# Patient Record
Sex: Female | Born: 1935 | Race: White | Hispanic: No | Smoking: Former smoker
Health system: Southern US, Community
[De-identification: ages and names within clinical notes are randomized; demographics above are authoritative.]

## PROBLEM LIST (undated history)

## (undated) DIAGNOSIS — I728 Aneurysm of other specified arteries: Secondary | ICD-10-CM

## (undated) DIAGNOSIS — C439 Malignant melanoma of skin, unspecified: Secondary | ICD-10-CM

## (undated) DIAGNOSIS — Z9889 Other specified postprocedural states: Secondary | ICD-10-CM

## (undated) DIAGNOSIS — B019 Varicella without complication: Secondary | ICD-10-CM

## (undated) DIAGNOSIS — R911 Solitary pulmonary nodule: Secondary | ICD-10-CM

## (undated) DIAGNOSIS — E039 Hypothyroidism, unspecified: Secondary | ICD-10-CM

## (undated) DIAGNOSIS — I2510 Atherosclerotic heart disease of native coronary artery without angina pectoris: Secondary | ICD-10-CM

## (undated) DIAGNOSIS — K635 Polyp of colon: Secondary | ICD-10-CM

## (undated) DIAGNOSIS — I10 Essential (primary) hypertension: Secondary | ICD-10-CM

## (undated) DIAGNOSIS — J439 Emphysema, unspecified: Secondary | ICD-10-CM

## (undated) DIAGNOSIS — I739 Peripheral vascular disease, unspecified: Secondary | ICD-10-CM

## (undated) HISTORY — PX: TONSILLECTOMY AND ADENOIDECTOMY: SUR1326

## (undated) HISTORY — DX: Emphysema, unspecified: J43.9

## (undated) HISTORY — DX: Polyp of colon: K63.5

## (undated) HISTORY — DX: Atherosclerotic heart disease of native coronary artery without angina pectoris: I25.10

## (undated) HISTORY — DX: Aneurysm of other specified arteries: I72.8

## (undated) HISTORY — DX: Malignant melanoma of skin, unspecified: C43.9

## (undated) HISTORY — DX: Essential (primary) hypertension: I10

## (undated) HISTORY — PX: THYROID SURGERY: SHX805

## (undated) HISTORY — PX: ANEURYSM COILING: SHX5349

## (undated) HISTORY — DX: Solitary pulmonary nodule: R91.1

## (undated) HISTORY — DX: Peripheral vascular disease, unspecified: I73.9

## (undated) HISTORY — DX: Varicella without complication: B01.9

## (undated) HISTORY — DX: Other specified postprocedural states: Z98.890

## (undated) HISTORY — DX: Hypothyroidism, unspecified: E03.9

## (undated) HISTORY — PX: OTHER SURGICAL HISTORY: SHX169

---

## 2018-04-12 ENCOUNTER — Encounter: Payer: Self-pay | Admitting: Adult Health

## 2018-04-12 ENCOUNTER — Ambulatory Visit (INDEPENDENT_AMBULATORY_CARE_PROVIDER_SITE_OTHER): Payer: Medicare Other | Admitting: Adult Health

## 2018-04-12 VITALS — BP 134/70 | Temp 98.5°F | Wt 175.0 lb

## 2018-04-12 DIAGNOSIS — C439 Malignant melanoma of skin, unspecified: Secondary | ICD-10-CM

## 2018-04-12 DIAGNOSIS — I728 Aneurysm of other specified arteries: Secondary | ICD-10-CM | POA: Diagnosis not present

## 2018-04-12 DIAGNOSIS — E039 Hypothyroidism, unspecified: Secondary | ICD-10-CM | POA: Insufficient documentation

## 2018-04-12 DIAGNOSIS — Z7689 Persons encountering health services in other specified circumstances: Secondary | ICD-10-CM

## 2018-04-12 DIAGNOSIS — I10 Essential (primary) hypertension: Secondary | ICD-10-CM | POA: Diagnosis not present

## 2018-04-12 LAB — BASIC METABOLIC PANEL
BUN: 16 mg/dL (ref 6–23)
CHLORIDE: 107 meq/L (ref 96–112)
CO2: 22 mEq/L (ref 19–32)
Calcium: 10.5 mg/dL (ref 8.4–10.5)
Creatinine, Ser: 0.8 mg/dL (ref 0.40–1.20)
GFR: 72.47 mL/min (ref >60.00)
Glucose, Bld: 115 mg/dL — ABNORMAL HIGH (ref 70–99)
Potassium: 4.3 mEq/L (ref 3.5–5.1)
Sodium: 140 mEq/L (ref 135–145)

## 2018-04-12 LAB — TSH: TSH: 0.18 u[IU]/mL — ABNORMAL LOW (ref 0.35–4.50)

## 2018-04-12 NOTE — Progress Notes (Signed)
Patient presents to clinic today to establish care. She is a pleasant 84 year old female who  has a past medical history of History of thyroid surgery.  Her daughter is with her to help provide history.   She recently moved from [facility] of last CPE  Acute Concerns: Establish Care   Chronic Issues: Post surgical Hypothyroidism - Takes Synthroid 112 mcg   Hypertension - takes Cozzar 50 mg daily  BP Readings from Last 3 Encounters:  04/12/18 134/70   H/O Melanoma - " years ago". Was not followed by dermatology in the past.   Hyperlipidemia - Not currently on any medications. Has myalgias with many statins   Seasonal Allergies - takes Allegra   Splenic artery aneurysm - was followed by vascular surgery in [place]. She would like to establish in [place].    Health Maintenance: Dental -- Routine Care  Vision -- Dr. Katy Fitch.  Immunizations -- utd  Colonoscopy -- no longer needs  Mammogram -- no longer needs PAP -- no longer needs Bone Density -- unknown    Past Medical History:  Diagnosis Date  . History of thyroid surgery     History reviewed. No pertinent surgical history.  Current Outpatient Medications on File Prior to Visit  Medication Sig Dispense Refill  . chlorpheniramine (CHLOR-TRIMETON) 4 MG tablet Take 4 mg by mouth as needed for allergies. Used along side of Allegra.    . fexofenadine (ALLEGRA) 180 MG tablet Take 180 mg by mouth daily.    Marland Kitchen levothyroxine (SYNTHROID, LEVOTHROID) 112 MCG tablet Take 112 mcg by mouth daily before breakfast.    . losartan (COZAAR) 50 MG tablet Take 50 mg by mouth daily.     No current facility-administered medications on file prior to visit.     Allergies  Allergen Reactions  . Codeine   . Statins     Myalgia   . Sulfa Antibiotics     Family History  Adopted: Yes    Social History   Socioeconomic History  . Marital status: Widowed    Spouse name: Not on file  . Number of children: Not on file  . Years of  education: Not on file  . Highest education level: Not on file  Occupational History  . Not on file  Social Needs  . Financial resource strain: Not on file  . Food insecurity:    Worry: Not on file    Inability: Not on file  . Transportation needs:    Medical: Not on file    Non-medical: Not on file  Tobacco Use  . Smoking status: Former Research scientist (life sciences)  . Smokeless tobacco: Never Used  Substance and Sexual Activity  . Alcohol use: Yes    Comment: Glass of wine occasionally  . Drug use: Never  . Sexual activity: Not on file  Lifestyle  . Physical activity:    Days per week: Not on file    Minutes per session: Not on file  . Stress: Not on file  Relationships  . Social connections:    Talks on phone: Not on file    Gets together: Not on file    Attends religious service: Not on file    Active member of club or organization: Not on file    Attends meetings of clubs or organizations: Not on file    Relationship status: Not on file  . Intimate partner violence:    Fear of current or ex partner: Not on file  Emotionally abused: Not on file    Physically abused: Not on file    Forced sexual activity: Not on file  Other Topics Concern  . Not on file  Social History Narrative  . Not on file    Review of Systems  Constitutional: Negative.   Respiratory: Negative.   Cardiovascular: Negative.   Genitourinary: Negative.   Musculoskeletal: Negative.   Neurological: Negative.   Psychiatric/Behavioral: Negative.   All other systems reviewed and are negative.   BP 134/70   Temp 98.5 F (36.9 C) (Oral)   Wt 175 lb (79.4 kg)   Physical Exam  Constitutional: She is oriented to person, place, and time. She appears well-developed and well-nourished. No distress.  Cardiovascular: Normal rate, regular rhythm, normal heart sounds and intact distal pulses. Exam reveals no gallop and no friction rub.  No murmur heard. Pulmonary/Chest: Effort normal and breath sounds normal. No stridor.  No respiratory distress. She has no wheezes. She has no rales. She exhibits no tenderness.  Neurological: She is alert and oriented to person, place, and time.  Skin: Skin is warm and dry. Capillary refill takes less than 2 seconds. No rash noted. She is not diaphoretic. No erythema. No pallor.  Psychiatric: She has a normal mood and affect. Her behavior is normal. Judgment and thought content normal.  Nursing note and vitals reviewed.  Assessment/Plan: 1. Encounter to establish care - Will request paperwork from old PCP in [place].  - Follow up at anytime in the meantime   2. Hypothyroidism, unspecified type  - levothyroxine (SYNTHROID, LEVOTHROID) 112 MCG tablet; Take 112 mcg by mouth daily before breakfast. - TSH  3. Essential hypertension - No change  - losartan (COZAAR) 50 MG tablet; Take 50 mg by mouth daily. - Basic Metabolic Panel  4. Splenic artery aneurysm [facility])  - Ambulatory referral to Vascular Surgery  5. Malignant melanoma, unspecified site [facility]) - She will establish care with her daughters dermatologist   Dorothyann Peng, NP

## 2018-04-12 NOTE — Patient Instructions (Signed)
It was great meeting you today   I will follow up with you regarding your blood work   We will request your information and be in touch with you about your next physical   Please let me know if you need anything

## 2018-04-13 ENCOUNTER — Encounter: Payer: Self-pay | Admitting: Family Medicine

## 2018-04-17 ENCOUNTER — Telehealth: Payer: Self-pay

## 2018-04-17 DIAGNOSIS — E039 Hypothyroidism, unspecified: Secondary | ICD-10-CM

## 2018-04-17 MED ORDER — LEVOTHYROXINE SODIUM 88 MCG PO TABS: 88.0000 ug | ORAL_TABLET | Freq: Every day | ORAL | 3 refills | Status: DC

## 2018-04-17 NOTE — Telephone Encounter (Signed)
Rx sent to pharmacy per result note.

## 2018-04-17 NOTE — Telephone Encounter (Signed)
Copied from [place] [id number]. Topic: General - Other >> Apr 17, 2018  2:12 PM Carolyn Stare wrote:  Pt went to [facility] there was no RX there and was told a new RX was going to be snet in for 88mg    Her synthroid dose is still too much. I am going to drop her to 88 mcg and have her retest in a month   [employer]

## 2018-04-18 ENCOUNTER — Encounter: Payer: Self-pay | Admitting: Family Medicine

## 2018-05-05 ENCOUNTER — Encounter: Payer: Medicare Other | Admitting: Vascular Surgery

## 2018-05-05 ENCOUNTER — Encounter (HOSPITAL_COMMUNITY): Payer: Medicare Other

## 2018-05-15 ENCOUNTER — Other Ambulatory Visit (INDEPENDENT_AMBULATORY_CARE_PROVIDER_SITE_OTHER): Payer: Medicare Other

## 2018-05-15 DIAGNOSIS — E039 Hypothyroidism, unspecified: Secondary | ICD-10-CM | POA: Diagnosis not present

## 2018-05-15 LAB — TSH: TSH: 5.15 u[IU]/mL — ABNORMAL HIGH (ref 0.35–4.50)

## 2018-05-17 ENCOUNTER — Other Ambulatory Visit: Payer: Self-pay | Admitting: Family Medicine

## 2018-05-17 ENCOUNTER — Telehealth: Payer: Self-pay | Admitting: Adult Health

## 2018-05-17 DIAGNOSIS — E039 Hypothyroidism, unspecified: Secondary | ICD-10-CM

## 2018-05-17 DIAGNOSIS — I10 Essential (primary) hypertension: Secondary | ICD-10-CM

## 2018-05-17 MED ORDER — LOSARTAN POTASSIUM 50 MG PO TABS: 50.0000 mg | ORAL_TABLET | Freq: Every day | ORAL | 3 refills | Status: DC

## 2018-05-17 NOTE — Telephone Encounter (Signed)
Copied from [place] [id number] [phone]. Topic: Quick Communication - See Telephone Encounter >> May 17, 2018 11:40 AM Burchel, Abbi R wrote: CRM for notification. See Telephone encounter for: 05/17/18.  Pt's daughter Gaetana Michaelis) states she has some questions about pt's recent lab work and would like call  back from [employer] or his nurse to discuss.    [name]: [phone]

## 2018-05-30 ENCOUNTER — Other Ambulatory Visit: Payer: Self-pay

## 2018-05-30 DIAGNOSIS — I728 Aneurysm of other specified arteries: Secondary | ICD-10-CM

## 2018-06-01 ENCOUNTER — Other Ambulatory Visit: Payer: Self-pay

## 2018-06-01 ENCOUNTER — Ambulatory Visit (INDEPENDENT_AMBULATORY_CARE_PROVIDER_SITE_OTHER): Payer: Medicare Other | Admitting: Vascular Surgery

## 2018-06-01 ENCOUNTER — Ambulatory Visit (HOSPITAL_COMMUNITY)
Admission: RE | Admit: 2018-06-01 | Discharge: 2018-06-01 | Disposition: A | Discharge status: Home / Self Care | Payer: Medicare Other | Source: Ambulatory Visit | Attending: Vascular Surgery | Admitting: Vascular Surgery

## 2018-06-01 ENCOUNTER — Encounter: Payer: Self-pay | Admitting: Vascular Surgery

## 2018-06-01 VITALS — BP 145/81 | HR 66 | Temp 97.1°F | Resp 16 | Ht 64.0 in | Wt 170.0 lb

## 2018-06-01 DIAGNOSIS — I728 Aneurysm of other specified arteries: Secondary | ICD-10-CM

## 2018-06-01 NOTE — Progress Notes (Addendum)
Referring Physician: Sallee Provencal, NP  Patient name: Doris Gray MRN: [id number] DOB: February 02, 1931 Sex: female  REASON FOR CONSULT: Splenic artery aneurysm  HPI: Doris Gray is a 85 y.o. female, with a known history of splenic artery aneurysm.  She recently moved here from [place].  Per history she had what sounds like a coil embolization of the splenic artery aneurysm in 2018 and a redo procedure in 2018.  She currently denies any abdominal pain.  Her only prior other abdominal operation was a cholecystectomy many years ago.  She also has a history of hypertension.  Her medical record suggests he may have COPD but she says that she does not have this.  Not currently on a statin secondary to myalgias.  She currently resides at an assisted living.  She is not overall very active but does have a desire to get out and walk and exercise more.  Past Medical History:  Diagnosis Date  . CAD (coronary artery disease)   . Chicken pox   . Colon polyps   . Emphysema of lung ([place])   . History of thyroid surgery   . Hypertension   . Hypothyroidism   . Melanoma ([place])   . Nodule of right lung    calcified granuloma 2.6 mm on CT from 2017   . Splenic artery aneurysm ([name])    2.6 cm    Past Surgical History:  Procedure Laterality Date  . OTHER SURGICAL HISTORY     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  . THYROID SURGERY    . TONSILLECTOMY AND ADENOIDECTOMY      Family History  Adopted: Yes    SOCIAL HISTORY: Social History   Socioeconomic History  . Marital status: Widowed    Spouse name: Not on file  . Number of children: Not on file  . Years of education: Not on file  . Highest education level: Bachelor's degree (e.g., BA, AB, BS)  Occupational History  . Occupation: Retired  Scientific laboratory technician  . Financial resource strain: Not on file  . Food insecurity:    Worry: Not on file    Inability: Not on file  . Transportation needs:    Medical: Not on file    Non-medical: Not on file    Tobacco Use  . Smoking status: Former Research scientist (life sciences)  . Smokeless tobacco: Never Used  Substance and Sexual Activity  . Alcohol use: Yes    Comment: Glass of wine occasionally  . Drug use: Never  . Sexual activity: Not on file  Lifestyle  . Physical activity:    Days per week: Not on file    Minutes per session: Not on file  . Stress: Not on file  Relationships  . Social connections:    Talks on phone: Not on file    Gets together: Not on file    Attends religious service: Not on file    Active member of club or organization: Not on file    Attends meetings of clubs or organizations: Not on file    Relationship status: Not on file  . Intimate partner violence:    Fear of current or ex partner: Not on file    Emotionally abused: Not on file    Physically abused: Not on file    Forced sexual activity: Not on file  Other Topics Concern  . Not on file  Social History Narrative  . Not on file    Allergies  Allergen Reactions  . Codeine   . Statins  Myalgia   . Sulfa Antibiotics     Current Outpatient Medications  Medication Sig Dispense Refill  . chlorpheniramine (CHLOR-TRIMETON) 4 MG tablet Take 4 mg by mouth as needed for allergies. Used along side of Allegra.    . fexofenadine (ALLEGRA) 180 MG tablet Take 180 mg by mouth daily.    Marland Kitchen levothyroxine (SYNTHROID, LEVOTHROID) 88 MCG tablet Take 1 tablet (88 mcg total) by mouth daily. 90 tablet 3  . losartan (COZAAR) 50 MG tablet Take 1 tablet (50 mg total) by mouth daily. 90 tablet 3   No current facility-administered medications for this visit.     ROS:   General:  No weight loss, Fever, chills  HEENT: No recent headaches, no nasal bleeding, no visual changes, no sore throat  Neurologic: No dizziness, blackouts, seizures. No recent symptoms of stroke or mini- stroke. No recent episodes of slurred speech, or temporary blindness.  Cardiac: No recent episodes of chest pain/pressure, no shortness of breath at rest.  No  shortness of breath with exertion.  Denies history of atrial fibrillation or irregular heartbeat  Vascular: No history of rest pain in feet.  No history of claudication.  No history of non-healing ulcer, No history of DVT   Pulmonary: No home oxygen, no productive cough, no hemoptysis,  No asthma or wheezing  Musculoskeletal:  [ ]  Arthritis, [ ]  Low back pain,  [ ]  Joint pain  Hematologic:No history of hypercoagulable state.  No history of easy bleeding.  No history of anemia  Gastrointestinal: No hematochezia or melena,  No gastroesophageal reflux, no trouble swallowing  Urinary: [ ]  chronic Kidney disease, [ ]  on HD - [ ]  MWF or [ ]  TTHS, [ ]  Burning with urination, [ ]  Frequent urination, [ ]  Difficulty urinating;   Skin: No rashes  Psychological: No history of anxiety,  No history of depression   Physical Examination  There were no vitals filed for this visit.  There is no height or weight on file to calculate BMI.  General:  Alert and oriented, no acute distress HEENT: Normal Neck: No bruit or JVD Pulmonary: Clear to auscultation bilaterally Cardiac: Regular Rate and Rhythm without murmur Abdomen: Soft, non-tender, non-distended, no mass Skin: No rash Extremity Pulses:  2+ radial, brachial, femoral pulses bilaterally Musculoskeletal: No deformity or edema  Neurologic: Upper and lower extremity motor 5/5 and symmetric  DATA:  Abdominal ultrasound was performed today in our office unfortunately due to overlying bowel gas the splenic artery aneurysm was not well visualized only portions of the proximal and distal artery were visualized.  These were normal with a 5 to 6 mm diameter.  ASSESSMENT: History of splenic artery aneurysm status post prior coil embolization.  She currently is asymptomatic.   PLAN: #1 since we were unable to visualize a splenic artery aneurysm using ultrasound we will get her scheduled for a CT angiogram of the abdomen and pelvis.  According to the  patient and her daughter this has not been imaged for 2 years.  Depending on the imaging findings we will decide whether or not any further intervention or follow-up surveillance is necessary.  I will review the CT scan findings as soon as she has this obtained.  I will can call the results to her daughter [name] at the patient's request at [phone]. We will schedule her for a one-year follow-up regardless and potentially with changes based on the CT scan findings.  We will also obtain her old records from operative notes  in [place].  2.  Hypertension patient currently has reasonable blood pressure on her current medications.  I emphasized to her today the blood pressure control would be important for preventing aneurysmal degeneration over time.   Ruta Hinds, MD [facility] Office: [phone] Pager: [phone]  Records received from [facility] states prior coil embolization splenic artery with regression.  No size mentioned in notes.  Last CT 01/2017  Ruta Hinds, MD [facility] [place] Office: [phone] Pager: [phone]

## 2018-06-13 ENCOUNTER — Other Ambulatory Visit: Payer: Medicare Other

## 2018-06-14 ENCOUNTER — Other Ambulatory Visit: Payer: Medicare Other

## 2018-06-19 ENCOUNTER — Other Ambulatory Visit (INDEPENDENT_AMBULATORY_CARE_PROVIDER_SITE_OTHER): Payer: Medicare Other

## 2018-06-19 DIAGNOSIS — I728 Aneurysm of other specified arteries: Secondary | ICD-10-CM

## 2018-06-19 DIAGNOSIS — E039 Hypothyroidism, unspecified: Secondary | ICD-10-CM | POA: Diagnosis not present

## 2018-06-19 LAB — TSH: TSH: 16.58 u[IU]/mL — AB (ref 0.35–4.50)

## 2018-06-21 ENCOUNTER — Other Ambulatory Visit: Payer: Medicare Other

## 2018-06-21 ENCOUNTER — Encounter: Payer: Self-pay | Admitting: Adult Health

## 2018-06-21 ENCOUNTER — Ambulatory Visit
Admission: RE | Admit: 2018-06-21 | Discharge: 2018-06-21 | Disposition: A | Discharge status: Home / Self Care | Payer: Medicare Other | Source: Ambulatory Visit | Attending: Vascular Surgery | Admitting: Vascular Surgery

## 2018-06-21 MED ORDER — IOPAMIDOL (ISOVUE-370) INJECTION 76%
75.0000 mL | Freq: Once | INTRAVENOUS | Status: AC | PRN
  Administered 2018-06-21: 75 mL via INTRAVENOUS

## 2018-06-21 NOTE — Telephone Encounter (Signed)
See lab results and documentation.

## 2018-06-22 ENCOUNTER — Other Ambulatory Visit: Payer: Self-pay | Admitting: Family Medicine

## 2018-06-22 ENCOUNTER — Telehealth: Payer: Self-pay | Admitting: Vascular Surgery

## 2018-06-22 ENCOUNTER — Other Ambulatory Visit: Payer: Self-pay | Admitting: Adult Health

## 2018-06-22 MED ORDER — LEVOTHYROXINE SODIUM 100 MCG PO TABS: 100.0000 ug | ORAL_TABLET | Freq: Every day | ORAL | 0 refills | Status: DC

## 2018-06-22 NOTE — Telephone Encounter (Signed)
Reviewed pt CT scan.  Aneurysm is 3 cm diameter.  Appears fairly well excluded although a lot of coil artifact. Distal splenic artery fills but most likely short gastrics  I believe arteriogram would be maximally invasive with minimal gain at this point  Will arrange follow up for pt in 1 year with repeat duplex  Spoke with daughter regarding CT and plan  Ruta Hinds, MD [facility] [place]: [phone] Pager: [phone]

## 2018-07-14 ENCOUNTER — Other Ambulatory Visit: Payer: Self-pay | Admitting: Adult Health

## 2018-07-14 ENCOUNTER — Encounter: Payer: Self-pay | Admitting: Family Medicine

## 2018-07-14 ENCOUNTER — Other Ambulatory Visit (INDEPENDENT_AMBULATORY_CARE_PROVIDER_SITE_OTHER): Payer: Medicare Other

## 2018-07-14 DIAGNOSIS — E039 Hypothyroidism, unspecified: Secondary | ICD-10-CM

## 2018-07-14 LAB — T4, FREE: Free T4: 0.83 ng/dL (ref 0.60–1.60)

## 2018-07-14 LAB — TSH: TSH: 18.39 u[IU]/mL — ABNORMAL HIGH (ref 0.35–4.50)

## 2018-07-14 LAB — T3, FREE: T3, Free: 2.7 pg/mL (ref 2.3–4.2)

## 2018-07-14 NOTE — Progress Notes (Signed)
TSH 

## 2018-07-17 ENCOUNTER — Encounter: Payer: Self-pay | Admitting: Adult Health

## 2018-07-18 ENCOUNTER — Other Ambulatory Visit: Payer: Self-pay | Admitting: Adult Health

## 2018-07-18 DIAGNOSIS — E039 Hypothyroidism, unspecified: Secondary | ICD-10-CM

## 2018-07-18 MED ORDER — LEVOTHYROXINE SODIUM 112 MCG PO TABS: 112.0000 ug | ORAL_TABLET | Freq: Every day | ORAL | 1 refills | Status: DC

## 2018-08-09 ENCOUNTER — Encounter: Payer: Self-pay | Admitting: Adult Health

## 2018-08-09 DIAGNOSIS — E039 Hypothyroidism, unspecified: Secondary | ICD-10-CM

## 2018-08-10 MED ORDER — LEVOTHYROXINE SODIUM 112 MCG PO TABS: 112.0000 ug | ORAL_TABLET | Freq: Every day | ORAL | 0 refills | Status: DC

## 2018-08-10 NOTE — Telephone Encounter (Signed)
Sent to the pharmacy by e-scribe. 

## 2018-09-05 ENCOUNTER — Encounter: Payer: Self-pay | Admitting: Adult Health

## 2018-09-26 ENCOUNTER — Encounter: Payer: Self-pay | Admitting: Adult Health

## 2018-09-26 NOTE — Telephone Encounter (Signed)
Cory please advise. Thanks

## 2018-09-27 ENCOUNTER — Encounter: Payer: Self-pay | Admitting: Adult Health

## 2018-09-27 ENCOUNTER — Other Ambulatory Visit: Payer: Self-pay

## 2018-09-27 ENCOUNTER — Ambulatory Visit (INDEPENDENT_AMBULATORY_CARE_PROVIDER_SITE_OTHER): Payer: Medicare Other | Admitting: Adult Health

## 2018-09-27 VITALS — BP 142/80 | Temp 98.2°F | Wt 180.0 lb

## 2018-09-27 DIAGNOSIS — E039 Hypothyroidism, unspecified: Secondary | ICD-10-CM

## 2018-09-27 DIAGNOSIS — H00011 Hordeolum externum right upper eyelid: Secondary | ICD-10-CM | POA: Diagnosis not present

## 2018-09-27 NOTE — Progress Notes (Signed)
Subjective:    Patient ID: Doris Gray, female    DOB: [birth year], 85 y.o.   MRN: [id number]  HPI 85 year old female who is being evaluated today in the office for an acute issue of redness, pain, and swelling to right upper eyelid.  This is been present for 2 days.  Denies any drainage.  Has not been using anything over-the-counter to help with discomfort.  She has not noticed any blurred vision or pain in the eye itself  She also needs to have her thyroid levels rechecked.  Currently prescribed Synthroid 125 mcg daily.  She does report taking this medication on a daily basis  Lab Results  Component Value Date   TSH 18.39 (H) 07/14/2018    Review of Systems See HPI   Past Medical History:  Diagnosis Date  . CAD (coronary artery disease)   . Chicken pox   . Colon polyps   . Emphysema of lung ([place])   . History of thyroid surgery   . Hypertension   . Hypothyroidism   . Melanoma ([place])   . Nodule of right lung    calcified granuloma 2.6 mm on CT from 2017   . Splenic artery aneurysm ([name])    2.6 cm     Social History   Socioeconomic History  . Marital status: Widowed    Spouse name: Not on file  . Number of children: Not on file  . Years of education: Not on file  . Highest education level: Bachelor's degree (e.g., BA, AB, BS)  Occupational History  . Occupation: Retired  Scientific laboratory technician  . Financial resource strain: Not on file  . Food insecurity:    Worry: Not on file    Inability: Not on file  . Transportation needs:    Medical: Not on file    Non-medical: Not on file  Tobacco Use  . Smoking status: Former Research scientist (life sciences)  . Smokeless tobacco: Never Used  Substance and Sexual Activity  . Alcohol use: Yes    Comment: Glass of wine occasionally  . Drug use: Never  . Sexual activity: Not on file  Lifestyle  . Physical activity:    Days per week: Not on file    Minutes per session: Not on file  . Stress: Not on file  Relationships  . Social connections:   Talks on phone: Not on file    Gets together: Not on file    Attends religious service: Not on file    Active member of club or organization: Not on file    Attends meetings of clubs or organizations: Not on file    Relationship status: Not on file  . Intimate partner violence:    Fear of current or ex partner: Not on file    Emotionally abused: Not on file    Physically abused: Not on file    Forced sexual activity: Not on file  Other Topics Concern  . Not on file  Social History Narrative  . Not on file    Past Surgical History:  Procedure Laterality Date  . OTHER SURGICAL HISTORY     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  . THYROID SURGERY    . TONSILLECTOMY AND ADENOIDECTOMY      Family History  Adopted: Yes    Allergies  Allergen Reactions  . Amlodipine Other (See Comments)    Pt denies   . Codeine   . Statins     Myalgia   . Sulfa Antibiotics   . Sulfamethoxazole-Trimethoprim Diarrhea  AKA Bactrim  . Sulfasalazine Other (See Comments) and Diarrhea  . Tetanus Toxoid Swelling    Had arm swelling  Pt denies    Current Outpatient Medications on File Prior to Visit  Medication Sig Dispense Refill  . chlorpheniramine (CHLOR-TRIMETON) 4 MG tablet Take 4 mg by mouth as needed for allergies. Used along side of Allegra.    . fexofenadine (ALLEGRA) 180 MG tablet Take 180 mg by mouth daily.    . fluticasone (FLONASE SENSIMIST) 27.5 MCG/SPRAY nasal spray Place 2 sprays into the nose daily.    Marland Kitchen ketotifen (ZADITOR) 0.025 % ophthalmic solution Apply 1 drop to eye 2 (two) times daily.    Marland Kitchen levothyroxine (SYNTHROID, LEVOTHROID) 112 MCG tablet Take 1 tablet (112 mcg total) by mouth daily. 60 tablet 0  . losartan (COZAAR) 50 MG tablet Take 1 tablet (50 mg total) by mouth daily. 90 tablet 3   No current facility-administered medications on file prior to visit.     BP (!) 142/80   Temp 98.2 F (36.8 C) (Oral)   Wt 180 lb (81.6 kg)   BMI 30.90 kg/m       Objective:   Physical Exam  Vitals signs and nursing note reviewed.  Constitutional:      Appearance: Normal appearance.  Eyes:     General: Vision grossly intact.        Right eye: Hordeolum present. No foreign body or discharge.        Left eye: No foreign body, discharge or hordeolum.     Conjunctiva/sclera: Conjunctivae normal.   Skin:    General: Skin is warm and dry.     Findings: Erythema and lesion present.  Neurological:     General: No focal deficit present.     Mental Status: She is oriented to person, place, and time.  Psychiatric:        Mood and Affect: Mood normal.        Behavior: Behavior normal.        Thought Content: Thought content normal.        Judgment: Judgment normal.       Assessment & Plan:  1. Hordeolum externum of right upper eyelid -Advised warm compress throughout the day as well as gentle lid massage.  If no improvement over the next 7 to 10 days and please follow-up.  2. Hypothyroidism, unspecified type - Consider increase in Synthroid  - TSH - T3, Free - T4, Free   Dorothyann Peng, NP

## 2018-09-27 NOTE — Progress Notes (Signed)
   Subjective:    Patient ID: Doris Gray, female    DOB: 1930-08-05, 85 y.o.   MRN: [id number]  HPI    Review of Systems     Objective:   Physical Exam        Assessment & Plan:

## 2018-09-27 NOTE — Patient Instructions (Addendum)
It was great seeing you today   You have a stye on the upper eye lid. Please place a warm compress on this eye multiple times throughout the day and do gentle eye lid massage. You can take Motrin or tylenol as needed for pain relief   We will follow up with you regarding your blood work about your thyroid

## 2018-10-03 ENCOUNTER — Encounter: Payer: Self-pay | Admitting: Adult Health

## 2018-10-17 ENCOUNTER — Telehealth: Payer: Self-pay | Admitting: *Deleted

## 2018-10-17 DIAGNOSIS — R413 Other amnesia: Secondary | ICD-10-CM

## 2018-10-17 NOTE — Addendum Note (Signed)
Addended by: Miles Costain T on: 10/17/2018 03:03 PM   Modules accepted: Orders

## 2018-10-17 NOTE — Telephone Encounter (Signed)
Ok for referral?

## 2018-10-17 NOTE — Telephone Encounter (Signed)
Copied from [place] [id number]. Topic: Referral - Request for Referral >> Oct 17, 2018 10:33 AM Oneta Rack wrote: Osvaldo Human name: Doris Gray  Relation to pt: son in law  Call back number: [phone]   Reason for call:  Son requesting referral to Dr.James M. Love neurologist due to patient dementia not improving, please notify son when referral is placed.   Dr. Alyson Locket. Love  [address], [place], [place] [address] Phone: [phone]

## 2018-10-17 NOTE — Telephone Encounter (Signed)
Referral placed.

## 2018-10-24 ENCOUNTER — Telehealth: Payer: Self-pay | Admitting: Diagnostic Neuroimaging

## 2018-10-24 NOTE — Telephone Encounter (Signed)
Pt gave consent for VV on the phone/ Pt understands that although there may be some limitations with this type of visit, we will take all precautions to reduce any security or privacy concerns.  Pt understands that this will be treated like an in office visit and we will file with pt's insurance, and there may be a patient responsible charge related to this service. Sent email with link to [email or web address]

## 2018-10-26 ENCOUNTER — Other Ambulatory Visit: Payer: Self-pay

## 2018-10-26 ENCOUNTER — Encounter: Payer: Self-pay | Admitting: Adult Health

## 2018-10-26 ENCOUNTER — Other Ambulatory Visit (INDEPENDENT_AMBULATORY_CARE_PROVIDER_SITE_OTHER): Payer: Medicare Other

## 2018-10-26 DIAGNOSIS — E039 Hypothyroidism, unspecified: Secondary | ICD-10-CM | POA: Diagnosis not present

## 2018-10-26 LAB — T4, FREE: Free T4: 1 ng/dL (ref 0.60–1.60)

## 2018-10-26 LAB — TSH: TSH: 8.63 u[IU]/mL — ABNORMAL HIGH (ref 0.35–4.50)

## 2018-10-26 LAB — T3, FREE: T3, Free: 2.8 pg/mL (ref 2.3–4.2)

## 2018-10-27 ENCOUNTER — Other Ambulatory Visit: Payer: Self-pay | Admitting: Adult Health

## 2018-10-27 DIAGNOSIS — E039 Hypothyroidism, unspecified: Secondary | ICD-10-CM

## 2018-10-27 MED ORDER — LEVOTHYROXINE SODIUM 125 MCG PO TABS: 125.0000 ug | ORAL_TABLET | Freq: Every day | ORAL | 1 refills | Status: DC

## 2018-10-30 ENCOUNTER — Ambulatory Visit: Payer: Self-pay | Admitting: Diagnostic Neuroimaging

## 2018-11-14 ENCOUNTER — Other Ambulatory Visit: Payer: Self-pay

## 2018-11-14 ENCOUNTER — Ambulatory Visit (INDEPENDENT_AMBULATORY_CARE_PROVIDER_SITE_OTHER): Payer: Medicare Other | Admitting: Neurology

## 2018-11-14 ENCOUNTER — Encounter: Payer: Self-pay | Admitting: Neurology

## 2018-11-14 VITALS — BP 125/79 | HR 75 | Ht 64.0 in | Wt 178.0 lb

## 2018-11-14 DIAGNOSIS — R413 Other amnesia: Secondary | ICD-10-CM

## 2018-11-14 NOTE — Patient Instructions (Signed)
Thank you for choosing [facility] for your neurological care! It was nice to meet you today! I appreciate that you entrust me with your healthcare concerns. I hope, I was able to address at least some of your concerns today, and that I can help you feel reassured and help you feel better.    Here is what we discussed today and what we came up with as our plan for you:    You have some complaints of memory loss: memory loss or changes in cognitive function can have many reasons and does not always mean you have dementia. Conditions that can contribute to subjective or objective memory loss include: depression, stress, poor sleep from insomnia or sleep apnea, dehydration, fluctuation in blood sugar values, thyroid or electrolyte dysfunction and certain vitamin deficiencies. Dementia can be caused by stroke, brain atherosclerosis or brain vascular disease due to vascular risk factors (smoking, high blood pressure, high cholesterol, obesity and uncontrolled diabetes), certain degenerative brain disorders (including Parkinson's disease and Multiple sclerosis) and by Alzheimer's disease or other, more rare and sometimes hereditary causes.  As discuessed, I would like to do some additional testing in the form of blood work, brain scan (called MRI) and a sleep study to rule out Obstructive sleep apnea. Please monitor your driving skills.  I will see you back after testing. Please call or email [place] once you are ready to pursue these tests at your next convenience.

## 2018-11-14 NOTE — Progress Notes (Signed)
Subjective:    Patient ID: Doris Gray is a 85 y.o. female.  HPI     Doris Age, MD, PhD [facility] [address] [place], [place] [address]  Dear Doris Gray,   I saw your patient, Doris Gray, upon your kind request in my neurologic clinic today for initial consultation of her memory loss.  The patient is accompanied by her daughter today. As you know, Doris Gray is a 85 year old right-handed woman with an underlying medical history of hypertension, hypothyroidism, lung emphysema, coronary artery disease, history of lung nodule, splenic artery aneurysm, history of melanoma, and obesity, who reports forgetfulness for the past few years.  The patient has a tendency to downplay her symptoms, she reports that she does not forget anything important and that she is used to driving and living independently.  Of note, her daughter, Doris Gray, wrote down her concerns on a separate piece of paper that she handed me directly (via my nurse, during triage), without the patient's knowledge as I can tell.  She reports that her mother has been not just forgetful and repeating herself but she has also been lost, she needs redirection repeatedly.  She has been forgetful about her pills.  She does not have accurate memory of past events either.  She also appears to be depressed and anxious at times.  Of note, patient moved from [place] on 02/07/2018.  However, she is under the impression, that she will be moving back to the [place] area.  She has 2 sons that live in [place] or in the [place] area, her daughter lives in [place].  The patient moved to a retirement community from another apartment building in April 2020.  She is adopted and family history is not known.  She quit smoking in the 70s.  She drinks alcohol rarely.  She drinks caffeine in the tea, 1 cup/day on average, drinks water approximately less than 4 cups/day or 2 gallons per week per daughter's  report.  She has been suspected to have obstructive sleep apnea and when she was hospitalized once in [place] she was advised to pursue a sleep study testing but it did not come to fruition.  She has never had a sleep study.  She snores.  She does not sleep very well. She admits, that she misses her husband of nearly 65 years, he passed away in 05-16-2016.  Daughter reports that patient's husband had Alzheimer's disease and she cared for him.  Her daughter is worried that the patient is going down a similar path.  She has driven after she moved here but she has been lost at least once about a month ago and had to ask for directions.  Her daughter lives close by, about a mile away but the patient was not able to get back to her own apartment complex after she left her daughter's house.  The patient reports that she was driving around in the neighborhood because it was a pretty neighborhood. She has had thyroid issues and is due to have blood work done for [employer].  She had a thyroid nodule removed.  She has never had a brain scan. Her daughter reports that she does not have lung emphysema and that this was an error.  She has seen a pulmonologist in [place] who told her that she does not have lung disease.  Her Past Medical History Is Significant For: Past Medical History:  Diagnosis Date  . CAD (coronary artery disease)   .  Chicken pox   . Colon polyps   . Emphysema of lung ([place])   . History of thyroid surgery   . Hypertension   . Hypothyroidism   . Melanoma ([place])   . Nodule of right lung    calcified granuloma 2.6 mm on CT from 2017   . Splenic artery aneurysm ([name])    2.6 cm     Her Past Surgical History Is Significant For: Past Surgical History:  Procedure Laterality Date  . OTHER SURGICAL HISTORY     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  . THYROID SURGERY    . TONSILLECTOMY AND ADENOIDECTOMY      Her Family History Is Significant For: Family History  Adopted: Yes    Her Social History Is Significant  For: Social History   Socioeconomic History  . Marital status: Widowed    Spouse name: Not on file  . Number of children: Not on file  . Years of education: Not on file  . Highest education level: Bachelor's degree (e.g., BA, AB, BS)  Occupational History  . Occupation: Retired  Scientific laboratory technician  . Financial resource strain: Not on file  . Food insecurity    Worry: Not on file    Inability: Not on file  . Transportation needs    Medical: Not on file    Non-medical: Not on file  Tobacco Use  . Smoking status: Former Research scientist (life sciences)  . Smokeless tobacco: Never Used  Substance and Sexual Activity  . Alcohol use: Yes    Comment: Glass of wine occasionally  . Drug use: Never  . Sexual activity: Not on file  Lifestyle  . Physical activity    Days per week: Not on file    Minutes per session: Not on file  . Stress: Not on file  Relationships  . Social Herbalist on phone: Not on file    Gets together: Not on file    Attends religious service: Not on file    Active member of club or organization: Not on file    Attends meetings of clubs or organizations: Not on file    Relationship status: Not on file  Other Topics Concern  . Not on file  Social History Narrative  . Not on file    Her Allergies Are:  Allergies  Allergen Reactions  . Amlodipine Other (See Comments)    Pt denies   . Codeine   . Statins     Myalgia   . Sulfa Antibiotics   . Sulfamethoxazole-Trimethoprim Diarrhea    AKA Bactrim  . Sulfasalazine Other (See Comments) and Diarrhea  . Tetanus Toxoid Swelling    Had arm swelling  Pt denies  :   Her Current Medications Are:  Outpatient Encounter Medications as of 11/14/2018  Medication Sig  . chlorpheniramine (CHLOR-TRIMETON) 4 MG tablet Take 4 mg by mouth as needed for allergies. Used along side of Allegra.  . fexofenadine (ALLEGRA) 180 MG tablet Take 180 mg by mouth daily.  . fluticasone (FLONASE SENSIMIST) 27.5 MCG/SPRAY nasal spray Place 2 sprays  into the nose daily.  Marland Kitchen ketotifen (ZADITOR) 0.025 % ophthalmic solution Apply 1 drop to eye 2 (two) times daily.  Marland Kitchen levothyroxine (SYNTHROID) 125 MCG tablet Take 1 tablet (125 mcg total) by mouth daily.  Marland Kitchen losartan (COZAAR) 50 MG tablet Take 1 tablet (50 mg total) by mouth daily.   No facility-administered encounter medications on file as of 11/14/2018.   :   Review of Systems:  Out of  a complete 14 point review of systems, all are reviewed and negative with the exception of these symptoms as listed below:    Review of Systems  Neurological:       Pt presents today to discuss her memory.    Objective:  Neurological Exam  Physical Exam Physical Examination:   Vitals:   11/14/18 1006  BP: 125/79  Pulse: 75    General Examination: The patient is a very pleasant 85 y.o. female in no acute distress. She appears well-developed and well-nourished and well groomed.   HEENT: Normocephalic, atraumatic, pupils are equal, round and reactive to light and accommodation.  Extraocular tracking is good without limitation to gaze excursion or nystagmus noted. Normal smooth pursuit is noted. Hearing is grossly intact. Face is symmetric with normal facial animation and normal facial sensation. Speech is clear with no dysarthria noted. There is no hypophonia. There is no lip, neck/head, jaw or voice tremor. Neck is supple with full range of passive and active motion. There are no carotid bruits on auscultation. Oropharynx exam reveals: moderate mouth dryness, adequate dental hygiene and moderate airway crowding, due to smaller airway entry and Redundant soft palate. Mallampati is class III. Tongue protrudes centrally and palate elevates symmetrically. Tonsils are absent. Tongue protrudes centrally in palate elevates symmetrically.  Neck circumference is 16-1/4 inches.  Chest: Clear to auscultation without wheezing, rhonchi or crackles noted.  Heart: S1+S2+0, regular and normal without murmurs, rubs or  gallops noted.   Abdomen: Soft, non-tender and non-distended with normal bowel sounds appreciated on auscultation.  Extremities: There is trace pitting edema in the distal lower extremities bilaterally.  Skin: Warm and dry without trophic changes noted.  Musculoskeletal: exam reveals no obvious joint deformities, tenderness or joint swelling or erythema.   Neurologically:  Mental status: The patient is awake, alert and oriented in all 4 spheres. Her immediate and remote memory, attention, language skills and fund of knowledge are Mildly impaired, she does have a tendency to repeat her self and does need a little bit of redirection at times.  She certainly seems to have a tendency to downplay her symptoms.  Her daughter is cautiously adding her side of things.  There is no evidence of aphasia, agnosia, apraxia or anomia. Speech is clear with normal prosody and enunciation. Thought process is linear. Mood is constricted and affect is slightly blunted.   On 11/14/2018: MMSE: 22/30, CDT: 4/4, AFT: 14/min.   Cranial nerves II - XII are as described above under HEENT exam. In addition: shoulder shrug is normal with equal shoulder height noted. Motor exam: Normal bulk, strength and tone is noted. There is no drift, No resting, postural or action tremor. Reflexes are 1+ throughout. Fine motor skills and coordination are fairly well-preserved for Gray. Cerebellar testing: No dysmetria or intention tremor.  Sensory exam: intact to light touch in the upper and lower extremities.  Gait, station and balance: She stands Without difficulty and can walk without major difficulty.  Assessment and Plan:   In summary, Callahan Peddie is a very pleasant 85 y.o.-year old female  with an underlying medical history of hypertension, hypothyroidism, lung emphysema (per chart review), coronary artery disease, history of lung nodule, splenic artery aneurysm, history of melanoma, and obesity, who Presents for evaluation of  her memory loss.  She does have a mildly abnormal memory score today.  She has a history of forgetfulness, tendency to repeat herself, some difficulty with directions and apparently got lost driving about a month ago.  Complicating factors include loss of husband less than 3 years ago, having to move from another state less than a year ago and an additional move about 3 months ago to a Retirement community.  She has vascular risk factors.  Family history is not known. I would like to suggest further work-up in the form of blood work to check vitamin B12, RPR, ESR, ANA.  She has blood work pending through your office.  She would like to wait and discuss further testing with her family first.  Her daughter suggested that they call back once she is ready to pursue further testing.  I Would like to pursue a brain MRI without contrast as well as a sleep study to rule out obstructive sleep apnea and I explained the connection between obstructive sleep apnea and memory loss/dementia.If she has obstructive sleep apnea she would certainly benefit from treating it with a CPAP machine.  For now, we mutually agreed to have her follow-up as needed and they will call or email through my chart once she is ready to pursue the brain scan and sleep study testing.  They are going to touch base with your office for blood work. We talked about the importance of maintaining a healthy lifestyle in general and staying active mentally and physically.   As far as medications are concerned, I recommended the following at this time: no change from my end of things.  I answered all their questions today and the patient and Doris Gray were in agreement with the above outlined plan. Thank you very much for allowing me to participate in the care of this nice patient. If I can be of any further assistance to you please do not hesitate to call me at [phone].  Sincerely,   Doris Age, MD, PhD

## 2018-11-23 ENCOUNTER — Other Ambulatory Visit: Payer: Self-pay

## 2018-11-23 ENCOUNTER — Other Ambulatory Visit (INDEPENDENT_AMBULATORY_CARE_PROVIDER_SITE_OTHER): Payer: Medicare Other

## 2018-11-23 DIAGNOSIS — E039 Hypothyroidism, unspecified: Secondary | ICD-10-CM

## 2018-11-23 LAB — TSH: TSH: 5.72 u[IU]/mL — ABNORMAL HIGH (ref 0.35–4.50)

## 2018-11-24 ENCOUNTER — Encounter: Payer: Self-pay | Admitting: Adult Health

## 2018-11-24 ENCOUNTER — Other Ambulatory Visit: Payer: Self-pay | Admitting: Adult Health

## 2018-11-24 DIAGNOSIS — E039 Hypothyroidism, unspecified: Secondary | ICD-10-CM

## 2018-11-24 MED ORDER — LEVOTHYROXINE SODIUM 125 MCG PO TABS: 125.0000 ug | ORAL_TABLET | Freq: Every day | ORAL | 1 refills | Status: DC

## 2019-02-22 ENCOUNTER — Other Ambulatory Visit: Payer: Self-pay

## 2019-02-22 ENCOUNTER — Encounter: Payer: Self-pay | Admitting: Adult Health

## 2019-02-22 ENCOUNTER — Ambulatory Visit (INDEPENDENT_AMBULATORY_CARE_PROVIDER_SITE_OTHER): Payer: Medicare Other | Admitting: Adult Health

## 2019-02-22 VITALS — BP 150/70 | Temp 97.4°F | Ht 63.0 in | Wt 184.0 lb

## 2019-02-22 DIAGNOSIS — I10 Essential (primary) hypertension: Secondary | ICD-10-CM

## 2019-02-22 DIAGNOSIS — Z23 Encounter for immunization: Secondary | ICD-10-CM

## 2019-02-22 DIAGNOSIS — R911 Solitary pulmonary nodule: Secondary | ICD-10-CM

## 2019-02-22 DIAGNOSIS — I728 Aneurysm of other specified arteries: Secondary | ICD-10-CM | POA: Diagnosis not present

## 2019-02-22 DIAGNOSIS — E039 Hypothyroidism, unspecified: Secondary | ICD-10-CM

## 2019-02-22 DIAGNOSIS — E782 Mixed hyperlipidemia: Secondary | ICD-10-CM

## 2019-02-22 LAB — CBC WITH DIFFERENTIAL/PLATELET
Basophils Absolute: 0.1 10*3/uL (ref 0.0–0.1)
Basophils Relative: 0.8 % (ref 0.0–3.0)
Eosinophils Absolute: 0.2 10*3/uL (ref 0.0–0.7)
Eosinophils Relative: 2.6 % (ref 0.0–5.0)
HCT: 40.9 % (ref 36.0–46.0)
Hemoglobin: 13.6 g/dL (ref 12.0–15.0)
Lymphocytes Relative: 19.4 % (ref 12.0–46.0)
Lymphs Abs: 1.3 10*3/uL (ref 0.7–4.0)
MCHC: 33.3 g/dL (ref 30.0–36.0)
MCV: 91.1 fl (ref 78.0–100.0)
Monocytes Absolute: 0.5 10*3/uL (ref 0.1–1.0)
Monocytes Relative: 7.1 % (ref 3.0–12.0)
Neutro Abs: 4.6 10*3/uL (ref 1.4–7.7)
Neutrophils Relative %: 70.1 % (ref 43.0–77.0)
Platelets: 235 10*3/uL (ref 150.0–400.0)
RBC: 4.49 Mil/uL (ref 3.87–5.11)
RDW: 14.1 % (ref 11.5–15.5)
WBC: 6.5 10*3/uL (ref 4.0–10.5)

## 2019-02-22 LAB — COMPREHENSIVE METABOLIC PANEL
ALT: 20 U/L (ref 0–35)
AST: 21 U/L (ref 0–37)
Albumin: 4.6 g/dL (ref 3.5–5.2)
Alkaline Phosphatase: 74 U/L (ref 39–117)
BUN: 13 mg/dL (ref 6–23)
CO2: 28 mEq/L (ref 19–32)
Calcium: 10.3 mg/dL (ref 8.4–10.5)
Chloride: 104 mEq/L (ref 96–112)
Creatinine, Ser: 0.85 mg/dL (ref 0.40–1.20)
GFR: 63.45 mL/min (ref >60.00)
Glucose, Bld: 86 mg/dL (ref 70–99)
Potassium: 4.6 mEq/L (ref 3.5–5.1)
Sodium: 139 mEq/L (ref 135–145)
Total Bilirubin: 0.5 mg/dL (ref 0.2–1.2)
Total Protein: 6.9 g/dL (ref 6.0–8.3)

## 2019-02-22 LAB — LIPID PANEL
Cholesterol: 221 mg/dL — ABNORMAL HIGH (ref 0–200)
HDL: 50.9 mg/dL (ref >39.00)
LDL Cholesterol: 134 mg/dL — ABNORMAL HIGH (ref 0–99)
NonHDL: 169.74
Total CHOL/HDL Ratio: 4
Triglycerides: 180 mg/dL — ABNORMAL HIGH (ref 0.0–149.0)
VLDL: 36 mg/dL (ref 0.0–40.0)

## 2019-02-22 LAB — TSH: TSH: 2.25 u[IU]/mL (ref 0.35–4.50)

## 2019-02-22 NOTE — Progress Notes (Signed)
Subjective:    Patient ID: Doris Gray, female    DOB: Aug 15, 1930, 85 y.o.   MRN: [id number]  HPI Patient presents for yearly preventative medicine examination. She is a pleasant 85 year old female who  has a past medical history of CAD (coronary artery disease), Chicken pox, Colon polyps, Emphysema of lung ([place]), History of thyroid surgery, Hypertension, Hypothyroidism, Melanoma ([place]), Nodule of right lung, and Splenic artery aneurysm ([place]).   She resides at a facility. Her daughter is here with her   Postsurgical hypothyroidism-takes Synthroid 112 mcg daily  Hypertension-well-controlled on Cozaar 50 mg daily.  She denies dizziness, lightheadedness, chest pain, shortness of breath. She did not take her BP medication this morning  BP Readings from Last 3 Encounters:  02/22/19 (!) 150/70  11/14/18 125/79  09/27/18 (!) 142/80   Hyperlipidemia -is intolerant to statins  Seasonal Allergies -controlled with Allegra  H/o Splenic Artery Aneurysm -denies any abdominal pain.  Per history it sounds like she had a coil embolization of the splenic artery aneurysm in 2018 and a redo procedure in 2018.  She is currently followed by Dr. Oneida Alar with vascular surgery.  Her last CT abdomen and pelvis was in February 2020, which showed   Multiple embolization coils in the mid and distal splenic artery associated with previous splenic artery aneurysm coiling. Unfortunately, the aneurysm itself is poorly characterized due to the metallic artifact and cannot evaluate for blood flow within the treated aneurysm sac. There is flow in the splenic artery proximal and distal to the coil pack which could be from collateral flow but cannot exclude blood flow within the treated aneurysm sac. This may be better characterized with vascular ultrasound or catheter angiography. 2. No other visceral artery aneurysms. 3. 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 4. Mild atherosclerotic disease in the  abdominal aorta without Aneurysm.  Lung Nodule -accidental finding on CT abdomen and pelvis in February.  Subsolid nodular density at the right lung base is indeterminate.  Recommend initial follow-up with CT scan in  6 to 12 months to confirm persistence  All immunizations and health maintenance protocols were reviewed with the patient and needed orders were placed. She is due for flu vaccination  Appropriate screening laboratory values were ordered for the patient including screening of hyperlipidemia, renal function and hepatic function  Medication reconciliation,  past medical history, social history, problem list and allergies were reviewed in detail with the patient  Goals were established with regard to weight loss, exercise, and  diet in compliance with medications  She has no acute complaints. She is sleeping well and appetite is good  Review of Systems  Constitutional: Negative.   HENT: Negative.   Eyes: Negative.   Respiratory: Negative.   Cardiovascular: Negative.   Gastrointestinal: Negative.   Endocrine: Negative.   Genitourinary: Negative.   Musculoskeletal: Negative.   Skin: Negative.   Allergic/Immunologic: Negative.   Neurological: Negative.   Hematological: Negative.   Psychiatric/Behavioral: Negative.    Past Medical History:  Diagnosis Date  . CAD (coronary artery disease)   . Chicken pox   . Colon polyps   . Emphysema of lung ([place])   . History of thyroid surgery   . Hypertension   . Hypothyroidism   . Melanoma ([place])   . Nodule of right lung    calcified granuloma 2.6 mm on CT from 2017   . Splenic artery aneurysm ([name])    2.6 cm     Social History   Socioeconomic  History  . Marital status: Widowed    Spouse name: Not on file  . Number of children: Not on file  . Years of education: Not on file  . Highest education level: Bachelor's degree (e.g., BA, AB, BS)  Occupational History  . Occupation: Retired  Scientific laboratory technician  . Financial resource  strain: Not on file  . Food insecurity    Worry: Not on file    Inability: Not on file  . Transportation needs    Medical: Not on file    Non-medical: Not on file  Tobacco Use  . Smoking status: Former Research scientist (life sciences)  . Smokeless tobacco: Never Used  Substance and Sexual Activity  . Alcohol use: Yes    Comment: Glass of wine occasionally  . Drug use: Never  . Sexual activity: Not on file  Lifestyle  . Physical activity    Days per week: Not on file    Minutes per session: Not on file  . Stress: Not on file  Relationships  . Social Herbalist on phone: Not on file    Gets together: Not on file    Attends religious service: Not on file    Active member of club or organization: Not on file    Attends meetings of clubs or organizations: Not on file    Relationship status: Not on file  . Intimate partner violence    Fear of current or ex partner: Not on file    Emotionally abused: Not on file    Physically abused: Not on file    Forced sexual activity: Not on file  Other Topics Concern  . Not on file  Social History Narrative  . Not on file    Past Surgical History:  Procedure Laterality Date  . OTHER SURGICAL HISTORY     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  . THYROID SURGERY    . TONSILLECTOMY AND ADENOIDECTOMY      Family History  Adopted: Yes    Allergies  Allergen Reactions  . Amlodipine Other (See Comments)    Pt denies   . Codeine   . Statins     Myalgia   . Sulfa Antibiotics   . Sulfamethoxazole-Trimethoprim Diarrhea    AKA Bactrim  . Sulfasalazine Other (See Comments) and Diarrhea  . Tetanus Toxoid Swelling    Had arm swelling  Pt denies    Current Outpatient Medications on File Prior to Visit  Medication Sig Dispense Refill  . chlorpheniramine (CHLOR-TRIMETON) 4 MG tablet Take 4 mg by mouth as needed for allergies. Used along side of Allegra.    . fexofenadine (ALLEGRA) 180 MG tablet Take 180 mg by mouth daily.    . fluticasone (FLONASE SENSIMIST) 27.5  MCG/SPRAY nasal spray Place 2 sprays into the nose daily.    Marland Kitchen ketotifen (ZADITOR) 0.025 % ophthalmic solution Apply 1 drop to eye 2 (two) times daily.    Marland Kitchen levothyroxine (SYNTHROID) 125 MCG tablet Take 1 tablet (125 mcg total) by mouth daily. 90 tablet 1  . losartan (COZAAR) 50 MG tablet Take 1 tablet (50 mg total) by mouth daily. 90 tablet 3   No current facility-administered medications on file prior to visit.     BP (!) 150/70   Temp (!) 97.4 F (36.3 C)   Ht 5\' 3"  (1.6 m)   Wt 184 lb (83.5 kg)   BMI 32.59 kg/m       Objective:   Physical Exam Vitals signs and nursing note reviewed.  Constitutional:  Appearance: Normal appearance.  Cardiovascular:     Rate and Rhythm: Normal rate and regular rhythm.     Pulses: Normal pulses.     Heart sounds: Normal heart sounds.  Pulmonary:     Effort: Pulmonary effort is normal.     Breath sounds: Normal breath sounds.  Abdominal:     General: Abdomen is flat.     Palpations: Abdomen is soft.  Skin:    General: Skin is warm and dry.     Capillary Refill: Capillary refill takes less than 2 seconds.  Neurological:     General: No focal deficit present.     Mental Status: She is alert and oriented to person, place, and time.  Psychiatric:        Mood and Affect: Mood normal.       Assessment & Plan:  1. Hypothyroidism, unspecified type - consider dose change of synthroid  - CBC with Differential/Platelet - Comprehensive metabolic panel - Lipid panel - TSH  2. Essential hypertension - Well controlled. No change in medications  - CBC with Differential/Platelet - Comprehensive metabolic panel - Lipid panel - TSH  3. Splenic artery aneurysm ([place]) - Follow up with vascular surgery as directed   4. Lung nodule - Patient and daughter will forgo CT scan at this time   5. Mixed hyperlipidemia - CBC with Differential/Platelet - Comprehensive metabolic panel - Lipid panel - TSH  Dorothyann Peng, NP

## 2019-02-22 NOTE — Patient Instructions (Signed)
It was great seeing you today   You received your flu shot today   We will follow up with you regarding your blood work and I will let you know if we need to make an adjustments on your thyroid medication   Please let me know if you need anything

## 2019-02-22 NOTE — Addendum Note (Signed)
Addended by: Miles Costain T on: 02/22/2019 11:49 AM   Modules accepted: Orders

## 2019-02-23 ENCOUNTER — Other Ambulatory Visit: Payer: Self-pay | Admitting: Adult Health

## 2019-02-23 DIAGNOSIS — E039 Hypothyroidism, unspecified: Secondary | ICD-10-CM

## 2019-02-23 MED ORDER — LEVOTHYROXINE SODIUM 125 MCG PO TABS: 125.0000 ug | ORAL_TABLET | Freq: Every day | ORAL | 3 refills | Status: DC

## 2019-04-10 ENCOUNTER — Other Ambulatory Visit: Payer: Self-pay

## 2019-04-10 ENCOUNTER — Ambulatory Visit (INDEPENDENT_AMBULATORY_CARE_PROVIDER_SITE_OTHER): Payer: Medicare Other | Admitting: Adult Health

## 2019-04-10 ENCOUNTER — Encounter: Payer: Self-pay | Admitting: Adult Health

## 2019-04-10 DIAGNOSIS — R319 Hematuria, unspecified: Secondary | ICD-10-CM

## 2019-04-10 LAB — POCT URINALYSIS DIPSTICK
Bilirubin, UA: NEGATIVE
Glucose, UA: NEGATIVE
Ketones, UA: NEGATIVE
Nitrite, UA: NEGATIVE
Protein, UA: POSITIVE — AB
Spec Grav, UA: 1.02 (ref 1.010–1.025)
Urobilinogen, UA: 0.2 E.U./dL
pH, UA: 6 (ref 5.0–8.0)

## 2019-04-10 MED ORDER — CIPROFLOXACIN HCL 500 MG PO TABS: 500.0000 mg | ORAL_TABLET | Freq: Two times a day (BID) | ORAL | 0 refills | Status: DC

## 2019-04-10 NOTE — Addendum Note (Signed)
Addended by: Raliegh Ip on: 04/10/2019 02:23 PM   Modules accepted: Orders

## 2019-04-10 NOTE — Progress Notes (Signed)
Subjective:    Patient ID: Doris Gray, female    DOB: 06-10-1930, 85 y.o.   MRN: [id number]  HPI 85 year old female who  has a past medical history of CAD (coronary artery disease), Chicken pox, Colon polyps, Emphysema of lung ([place]), History of thyroid surgery, Hypertension, Hypothyroidism, Melanoma ([place]), Nodule of right lung, and Splenic artery aneurysm ([place]).  She presents to the office today for complaint of hematuria. She noticed yesterday that when she wiped she noticed a trace amount of bright red blood on the toilet paper. She denies rectal bleeding or vaginal bleeding. Has not noticed acute low back pain, dysuria, abdominal pain or pressure, frequency, or urgency.    Review of Systems See HPI   Past Medical History:  Diagnosis Date  . CAD (coronary artery disease)   . Chicken pox   . Colon polyps   . Emphysema of lung ([place])   . History of thyroid surgery   . Hypertension   . Hypothyroidism   . Melanoma ([place])   . Nodule of right lung    calcified granuloma 2.6 mm on CT from 2017   . Splenic artery aneurysm ([name])    2.6 cm     Social History   Socioeconomic History  . Marital status: Widowed    Spouse name: Not on file  . Number of children: Not on file  . Years of education: Not on file  . Highest education level: Bachelor's degree (e.g., BA, AB, BS)  Occupational History  . Occupation: Retired  Scientific laboratory technician  . Financial resource strain: Not on file  . Food insecurity    Worry: Not on file    Inability: Not on file  . Transportation needs    Medical: Not on file    Non-medical: Not on file  Tobacco Use  . Smoking status: Former Research scientist (life sciences)  . Smokeless tobacco: Never Used  Substance and Sexual Activity  . Alcohol use: Yes    Comment: Glass of wine occasionally  . Drug use: Never  . Sexual activity: Not on file  Lifestyle  . Physical activity    Days per week: Not on file    Minutes per session: Not on file  . Stress: Not on file  Relationships   . Social Herbalist on phone: Not on file    Gets together: Not on file    Attends religious service: Not on file    Active member of club or organization: Not on file    Attends meetings of clubs or organizations: Not on file    Relationship status: Not on file  . Intimate partner violence    Fear of current or ex partner: Not on file    Emotionally abused: Not on file    Physically abused: Not on file    Forced sexual activity: Not on file  Other Topics Concern  . Not on file  Social History Narrative  . Not on file    Past Surgical History:  Procedure Laterality Date  . OTHER SURGICAL HISTORY     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  . THYROID SURGERY    . TONSILLECTOMY AND ADENOIDECTOMY      Family History  Adopted: Yes    Allergies  Allergen Reactions  . Amlodipine Other (See Comments)    Pt denies   . Codeine   . Statins     Myalgia   . Sulfa Antibiotics   . Sulfamethoxazole-Trimethoprim Diarrhea    AKA Bactrim  .  Sulfasalazine Other (See Comments) and Diarrhea  . Tetanus Toxoid Swelling    Had arm swelling  Pt denies    Current Outpatient Medications on File Prior to Visit  Medication Sig Dispense Refill  . chlorpheniramine (CHLOR-TRIMETON) 4 MG tablet Take 4 mg by mouth as needed for allergies. Used along side of Allegra.    . fexofenadine (ALLEGRA) 180 MG tablet Take 180 mg by mouth daily.    . fluticasone (FLONASE SENSIMIST) 27.5 MCG/SPRAY nasal spray Place 2 sprays into the nose daily.    Marland Kitchen ketotifen (ZADITOR) 0.025 % ophthalmic solution Apply 1 drop to eye 2 (two) times daily.    Marland Kitchen levothyroxine (SYNTHROID) 125 MCG tablet Take 1 tablet (125 mcg total) by mouth daily. 90 tablet 3  . losartan (COZAAR) 50 MG tablet Take 1 tablet (50 mg total) by mouth daily. 90 tablet 3   No current facility-administered medications on file prior to visit.     There were no vitals taken for this visit.      Objective:   Physical Exam Vitals signs and nursing note  reviewed.  Constitutional:      Appearance: Normal appearance.  Cardiovascular:     Rate and Rhythm: Normal rate and regular rhythm.     Pulses: Normal pulses.     Heart sounds: Normal heart sounds.  Pulmonary:     Effort: Pulmonary effort is normal.     Breath sounds: Normal breath sounds.  Abdominal:     General: Abdomen is flat. Bowel sounds are normal.  Skin:    General: Skin is warm and dry.     Capillary Refill: Capillary refill takes less than 2 seconds.     Coloration: Skin is not jaundiced or pale.     Findings: No bruising, erythema, lesion or rash.  Neurological:     General: No focal deficit present.     Mental Status: She is alert and oriented to person, place, and time.  Psychiatric:        Mood and Affect: Mood normal.        Behavior: Behavior normal.        Thought Content: Thought content normal.        Judgment: Judgment normal.       Assessment & Plan:  1. Hematuria, unspecified type - POC Urinalysis Dipstick + blood and leuks. Will send for culture and start on abx d/t age.  - Drink plenty of water.  - Urine culture; Future - ciprofloxacin (CIPRO) 500 MG tablet; Take 1 tablet (500 mg total) by mouth 2 (two) times daily.  Dispense: 6 tablet; Refill: 0  Dorothyann Peng, NP

## 2019-04-13 LAB — URINE CULTURE
MICRO NUMBER:: 1150946
SPECIMEN QUALITY:: ADEQUATE

## 2019-05-12 ENCOUNTER — Encounter: Payer: Self-pay | Admitting: Adult Health

## 2019-05-12 ENCOUNTER — Other Ambulatory Visit: Payer: Self-pay | Admitting: Adult Health

## 2019-05-12 DIAGNOSIS — I10 Essential (primary) hypertension: Secondary | ICD-10-CM

## 2019-05-12 DIAGNOSIS — E039 Hypothyroidism, unspecified: Secondary | ICD-10-CM

## 2019-05-14 MED ORDER — LOSARTAN POTASSIUM 50 MG PO TABS: 50.0000 mg | ORAL_TABLET | Freq: Every day | ORAL | 3 refills | Status: DC

## 2019-05-14 MED ORDER — LEVOTHYROXINE SODIUM 125 MCG PO TABS: 125.0000 ug | ORAL_TABLET | Freq: Every day | ORAL | 3 refills | Status: DC

## 2019-11-02 ENCOUNTER — Encounter: Payer: Self-pay | Admitting: Adult Health

## 2019-12-04 ENCOUNTER — Ambulatory Visit (INDEPENDENT_AMBULATORY_CARE_PROVIDER_SITE_OTHER): Payer: Medicare Other | Admitting: Adult Health

## 2019-12-04 ENCOUNTER — Encounter: Payer: Self-pay | Admitting: Adult Health

## 2019-12-04 ENCOUNTER — Other Ambulatory Visit: Payer: Self-pay

## 2019-12-04 VITALS — BP 112/70 | Temp 97.8°F | Wt 196.0 lb

## 2019-12-04 DIAGNOSIS — R635 Abnormal weight gain: Secondary | ICD-10-CM | POA: Diagnosis not present

## 2019-12-04 DIAGNOSIS — R2689 Other abnormalities of gait and mobility: Secondary | ICD-10-CM

## 2019-12-04 NOTE — Progress Notes (Signed)
Subjective:    Patient ID: Doris Gray, female    DOB: 1930/10/01, 86 y.o.   MRN: [id number]  HPI  86 year old female who  has a past medical history of CAD (coronary artery disease), Chicken pox, Colon polyps, Emphysema of lung ([place]), History of thyroid surgery, Hypertension, Hypothyroidism, Melanoma ([place]), Nodule of right lung, and Splenic artery aneurysm ([place]).  She presents to the office today with her daughter for weight gain over the last few months. She is sedentary and her diet is poor.  Due to history of hypothyroidism her daughter would like to make sure that her thyroid and other labs are okay.  He denies lower extremity edema, chest pain, or shortness of breath.  Wt Readings from Last 3 Encounters:  12/04/19 196 lb (88.9 kg)  02/22/19 184 lb (83.5 kg)  11/14/18 178 lb (80.7 kg)   Her daughter also reports that she has noticed that her mother has been limping over the last 2 to 3 months.  Limping gets worse the longer she walks.  Daughter thought it may have been her pair shoes so they bought her some new shoes walking shoes but despite this she continues to limp.  Patient denies pain in her hips, legs, or falls.   Review of Systems See HPI   Past Medical History:  Diagnosis Date   CAD (coronary artery disease)    Chicken pox    Colon polyps    Emphysema of lung ([place])    History of thyroid surgery    Hypertension    Hypothyroidism    Melanoma ([place])    Nodule of right lung    calcified granuloma 2.6 mm on CT from 2017    Splenic artery aneurysm ([name])    2.6 cm     Social History   Socioeconomic History   Marital status: Widowed    Spouse name: Not on file   Number of children: Not on file   Years of education: Not on file   Highest education level: Bachelor's degree (e.g., BA, AB, BS)  Occupational History   Occupation: Retired  Tobacco Use   Smoking status: Former Smoker   Smokeless tobacco: Never Used  Scientific laboratory technician Use:  Never used  Substance and Sexual Activity   Alcohol use: Yes    Comment: Glass of wine occasionally   Drug use: Never   Sexual activity: Not on file  Other Topics Concern   Not on file  Social History Narrative   Not on file   Social Determinants of Health   Financial Resource Strain:    Difficulty of Paying Living Expenses:   Food Insecurity:    Worried About Charity fundraiser in the Last Year:    Arboriculturist in the Last Year:   Transportation Needs:    Film/video editor (Medical):    Lack of Transportation (Non-Medical):   Physical Activity:    Days of Exercise per Week:    Minutes of Exercise per Session:   Stress:    Feeling of Stress :   Social Connections:    Frequency of Communication with Friends and Family:    Frequency of Social Gatherings with Friends and Family:    Attends Religious Services:    Active Member of Clubs or Organizations:    Attends Archivist Meetings:    Marital Status:   Intimate Partner Violence:    Fear of Current or Ex-Partner:    Emotionally Abused:  Physically Abused:    Sexually Abused:     Past Surgical History:  Procedure Laterality Date   OTHER SURGICAL HISTORY     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   THYROID SURGERY     TONSILLECTOMY AND ADENOIDECTOMY      Family History  Adopted: Yes    Allergies  Allergen Reactions   Amlodipine Other (See Comments)    Pt denies    Codeine    Statins     Myalgia    Sulfa Antibiotics    Sulfamethoxazole-Trimethoprim Diarrhea    AKA Bactrim   Sulfasalazine Other (See Comments) and Diarrhea   Tetanus Toxoid Swelling    Had arm swelling  Pt denies    Current Outpatient Medications on File Prior to Visit  Medication Sig Dispense Refill   fexofenadine (ALLEGRA) 180 MG tablet Take 180 mg by mouth daily.     levothyroxine (SYNTHROID) 125 MCG tablet Take 1 tablet (125 mcg total) by mouth daily. 90 tablet 3   losartan (COZAAR) 50 MG tablet  TAKE ONE TABLET BY MOUTH DAILY 90 tablet 0   losartan (COZAAR) 50 MG tablet Take 1 tablet (50 mg total) by mouth daily. 90 tablet 3   No current facility-administered medications on file prior to visit.    BP 112/70    Temp 97.8 F (36.6 C)    Wt 196 lb (88.9 kg)    BMI 34.72 kg/m       Objective:   Physical Exam Vitals and nursing note reviewed.  Constitutional:      Appearance: Normal appearance.  Pulmonary:     Effort: Pulmonary effort is normal.     Breath sounds: Normal breath sounds.  Musculoskeletal:        General: Tenderness (Very mild tenderness with palpation to left hip) present. Normal range of motion.     Right lower leg: No edema.     Left lower leg: No edema.     Comments: Noticeable limping gait on the left  Skin:    General: Skin is warm and dry.  Neurological:     General: No focal deficit present.     Mental Status: She is alert and oriented to person, place, and time.  Psychiatric:        Mood and Affect: Mood normal.        Behavior: Behavior normal.        Thought Content: Thought content normal.        Judgment: Judgment normal.       Assessment & Plan:  1. Weight gain - 12 pound weight gain over 9 months.  Likely due to sedentary lifestyle and poor diet. -She was encouraged to eat a healthier diet  and do more walking at her facility. - Basic Metabolic Panel; Future - TSH; Future - CBC with Differential/Platelet; Future - Basic Metabolic Panel - TSH - CBC with Differential/Platelet  2. Limping gait determined by examination - Possible osteoarthritis vs muscle weakness from sedentary lifestyle  - DG Hip Unilat W OR W/O Pelvis 2-3 Views Left; Future  Dorothyann Peng, NP

## 2019-12-04 NOTE — Patient Instructions (Signed)
It was great seeing you today   I would like to get you to do some walking   I will check some lab work and follow up with you once I get it back

## 2019-12-05 ENCOUNTER — Telehealth: Payer: Self-pay | Admitting: Adult Health

## 2019-12-05 DIAGNOSIS — E039 Hypothyroidism, unspecified: Secondary | ICD-10-CM

## 2019-12-05 LAB — BASIC METABOLIC PANEL
BUN/Creatinine Ratio: 15 (calc) (ref 6–22)
BUN: 15 mg/dL (ref 7–25)
CO2: 24 mmol/L (ref 20–32)
Calcium: 10.5 mg/dL — ABNORMAL HIGH (ref 8.6–10.4)
Chloride: 104 mmol/L (ref 98–110)
Creat: 1.01 mg/dL — ABNORMAL HIGH (ref 0.60–0.88)
Glucose, Bld: 135 mg/dL — ABNORMAL HIGH (ref 65–99)
Potassium: 4.3 mmol/L (ref 3.5–5.3)
Sodium: 139 mmol/L (ref 135–146)

## 2019-12-05 LAB — CBC WITH DIFFERENTIAL/PLATELET
Absolute Monocytes: 542 cells/uL (ref 200–950)
Basophils Absolute: 77 cells/uL (ref 0–200)
Basophils Relative: 0.9 %
Eosinophils Absolute: 198 cells/uL (ref 15–500)
Eosinophils Relative: 2.3 %
HCT: 44.1 % (ref 35.0–45.0)
Hemoglobin: 14.9 g/dL (ref 11.7–15.5)
Lymphs Abs: 2064 cells/uL (ref 850–3900)
MCH: 30.7 pg (ref 27.0–33.0)
MCHC: 33.8 g/dL (ref 32.0–36.0)
MCV: 90.9 fL (ref 80.0–100.0)
MPV: 10.5 fL (ref 7.5–12.5)
Monocytes Relative: 6.3 %
Neutro Abs: 5719 cells/uL (ref 1500–7800)
Neutrophils Relative %: 66.5 %
Platelets: 298 10*3/uL (ref 140–400)
RBC: 4.85 10*6/uL (ref 3.80–5.10)
RDW: 13.1 % (ref 11.0–15.0)
Total Lymphocyte: 24 %
WBC: 8.6 10*3/uL (ref 3.8–10.8)

## 2019-12-05 LAB — TSH: TSH: 16.25 mIU/L — ABNORMAL HIGH (ref 0.40–4.50)

## 2019-12-05 MED ORDER — LEVOTHYROXINE SODIUM 137 MCG PO TABS: 137.0000 ug | ORAL_TABLET | Freq: Every day | ORAL | 1 refills | Status: DC

## 2019-12-05 NOTE — Telephone Encounter (Signed)
Spoke to the patient's daughter, [name] and informed her of Heba's labs.  TSH has increased from 2.25 to 16.25 mg.  Daughter reports that Sueann's medications are not on pillbox and that the patient has been taking her Synthroid.  We will increase Synthroid from 125 mcg to 137 mcg and repeat labs in 3 weeks

## 2019-12-12 ENCOUNTER — Other Ambulatory Visit: Payer: Self-pay

## 2019-12-12 DIAGNOSIS — I728 Aneurysm of other specified arteries: Secondary | ICD-10-CM

## 2019-12-26 ENCOUNTER — Other Ambulatory Visit: Payer: Medicare Other

## 2019-12-26 ENCOUNTER — Other Ambulatory Visit: Payer: Self-pay

## 2019-12-26 DIAGNOSIS — E039 Hypothyroidism, unspecified: Secondary | ICD-10-CM

## 2019-12-27 ENCOUNTER — Ambulatory Visit (HOSPITAL_COMMUNITY)
Admission: RE | Admit: 2019-12-27 | Discharge: 2019-12-27 | Disposition: A | Discharge status: Home / Self Care | Payer: Medicare Other | Source: Ambulatory Visit | Attending: Vascular Surgery | Admitting: Vascular Surgery

## 2019-12-27 ENCOUNTER — Other Ambulatory Visit: Payer: Self-pay | Admitting: Adult Health

## 2019-12-27 ENCOUNTER — Encounter: Payer: Self-pay | Admitting: Vascular Surgery

## 2019-12-27 ENCOUNTER — Ambulatory Visit (INDEPENDENT_AMBULATORY_CARE_PROVIDER_SITE_OTHER): Payer: Medicare Other | Admitting: Vascular Surgery

## 2019-12-27 VITALS — BP 146/85 | HR 60 | Temp 97.9°F | Resp 20 | Ht 63.0 in | Wt 193.0 lb

## 2019-12-27 DIAGNOSIS — I728 Aneurysm of other specified arteries: Secondary | ICD-10-CM

## 2019-12-27 DIAGNOSIS — I83813 Varicose veins of bilateral lower extremities with pain: Secondary | ICD-10-CM | POA: Diagnosis not present

## 2019-12-27 DIAGNOSIS — E039 Hypothyroidism, unspecified: Secondary | ICD-10-CM

## 2019-12-27 LAB — TSH: TSH: 11.09 mIU/L — ABNORMAL HIGH (ref 0.40–4.50)

## 2019-12-27 LAB — T3, FREE: T3, Free: 2.6 pg/mL (ref 2.3–4.2)

## 2019-12-27 LAB — T4, FREE: Free T4: 1.4 ng/dL (ref 0.8–1.8)

## 2019-12-27 MED ORDER — LEVOTHYROXINE SODIUM 150 MCG PO TABS: 137.0000 ug | ORAL_TABLET | Freq: Every day | ORAL | 0 refills | Status: DC

## 2019-12-27 NOTE — Progress Notes (Signed)
Patient is an 86 year old female who returns for follow-up today.  She was previously treated for a splenic artery aneurysm in [place].  I last saw her in February 2020.  At that time CT scan of the abdomen and pelvis showed a well excluded 3 cm splenic artery aneurysm.  Patient has had no abdominal pain.  She has otherwise felt well and able to perform her daily activities.  She did notice that she has developed more prominent varicose veins.  She does not really have any pain from these.  She does have some trace ankle edema on occasion.  She has not worn compression stockings.  Review of systems: She has no shortness of breath.  She has no chest pain.  Current Outpatient Medications on File Prior to Visit  Medication Sig Dispense Refill  . fexofenadine (ALLEGRA) 180 MG tablet Take 180 mg by mouth daily.    Marland Kitchen losartan (COZAAR) 50 MG tablet Take 1 tablet (50 mg total) by mouth daily. 90 tablet 3   No current facility-administered medications on file prior to visit.    Past Surgical History:  Procedure Laterality Date  . OTHER SURGICAL HISTORY     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  . THYROID SURGERY    . TONSILLECTOMY AND ADENOIDECTOMY      Past Medical History:  Diagnosis Date  . CAD (coronary artery disease)   . Chicken pox   . Colon polyps   . Emphysema of lung ([place])   . History of thyroid surgery   . Hypertension   . Hypothyroidism   . Melanoma ([place])   . Nodule of right lung    calcified granuloma 2.6 mm on CT from 2017   . Splenic artery aneurysm ([name])    2.6 cm      Physical exam:  Vitals:   12/27/19 1010  BP: (!) 146/85  Pulse: 60  Resp: 20  Temp: 97.9 F (36.6 C)  SpO2: 96%  Weight: 193 lb (87.5 kg)  Height: 5\' 3"  (1.6 m)    Abdomen: Soft nontender nondistended no mass  Extremities: 2+ dorsalis pedis pulses bilaterally, scattered varicose veins bilaterally  Data: Patient had an ultrasound of her abdomen today we were unable to visualize the splenic artery  aneurysm.  There was flow on the proximal distal aspects of the artery which were not aneurysmal.  Assessment: Patient with prior coil embolization of splenic artery.  This appeared to be successful on CT scan in February 2020.  We have done 2 separate ultrasounds which did not really show significant aneurysm.  There does not appear to be flow within the aneurysm at this point.  2.  Varicose veins bilateral lower extremities asymptomatic  Plan: No further follow-up necessary for her splenic artery aneurysm unless she develops symptoms of abdominal pain.  2 patient will wear compression stockings if she develops more symptoms for the varicose veins in her lower extremities.  Otherwise pathophysiology of varicose veins and their benign course were discussed with the patient and her daughter today.  Patient will follow up on an as-needed basis.  Ruta Hinds, MD [facility] Office: [phone]

## 2020-01-17 ENCOUNTER — Other Ambulatory Visit: Payer: Self-pay

## 2020-01-17 ENCOUNTER — Other Ambulatory Visit: Payer: Medicare Other

## 2020-01-17 DIAGNOSIS — E039 Hypothyroidism, unspecified: Secondary | ICD-10-CM

## 2020-01-17 LAB — TSH: TSH: 1.03 mIU/L (ref 0.40–4.50)

## 2020-01-21 ENCOUNTER — Encounter: Payer: Self-pay | Admitting: Adult Health

## 2020-01-22 ENCOUNTER — Other Ambulatory Visit: Payer: Self-pay | Admitting: Adult Health

## 2020-01-22 DIAGNOSIS — E039 Hypothyroidism, unspecified: Secondary | ICD-10-CM

## 2020-01-22 MED ORDER — LEVOTHYROXINE SODIUM 150 MCG PO TABS: 137.0000 ug | ORAL_TABLET | Freq: Every day | ORAL | 3 refills | Status: DC

## 2020-04-17 ENCOUNTER — Encounter: Payer: BLUE CROSS/BLUE SHIELD | Admitting: Adult Health

## 2020-05-15 ENCOUNTER — Encounter: Payer: BLUE CROSS/BLUE SHIELD | Admitting: Adult Health

## 2020-05-23 ENCOUNTER — Telehealth: Payer: Self-pay | Admitting: Adult Health

## 2020-05-23 ENCOUNTER — Other Ambulatory Visit: Payer: Self-pay

## 2020-05-23 ENCOUNTER — Encounter: Payer: Self-pay | Admitting: Adult Health

## 2020-05-23 ENCOUNTER — Ambulatory Visit (INDEPENDENT_AMBULATORY_CARE_PROVIDER_SITE_OTHER): Payer: Medicare Other | Admitting: Adult Health

## 2020-05-23 VITALS — BP 120/80 | Temp 98.0°F | Ht 62.5 in | Wt 192.0 lb

## 2020-05-23 DIAGNOSIS — Z23 Encounter for immunization: Secondary | ICD-10-CM

## 2020-05-23 DIAGNOSIS — E039 Hypothyroidism, unspecified: Secondary | ICD-10-CM | POA: Diagnosis not present

## 2020-05-23 DIAGNOSIS — I10 Essential (primary) hypertension: Secondary | ICD-10-CM | POA: Diagnosis not present

## 2020-05-23 DIAGNOSIS — I728 Aneurysm of other specified arteries: Secondary | ICD-10-CM

## 2020-05-23 DIAGNOSIS — E782 Mixed hyperlipidemia: Secondary | ICD-10-CM

## 2020-05-23 LAB — COMPREHENSIVE METABOLIC PANEL
ALT: 41 U/L — ABNORMAL HIGH (ref 0–35)
AST: 36 U/L (ref 0–37)
Albumin: 4.5 g/dL (ref 3.5–5.2)
Alkaline Phosphatase: 80 U/L (ref 39–117)
BUN: 10 mg/dL (ref 6–23)
CO2: 26 mEq/L (ref 19–32)
Calcium: 10.6 mg/dL — ABNORMAL HIGH (ref 8.4–10.5)
Chloride: 107 mEq/L (ref 96–112)
Creatinine, Ser: 0.89 mg/dL (ref 0.40–1.20)
GFR: 58.45 mL/min — ABNORMAL LOW (ref >60.00)
Glucose, Bld: 103 mg/dL — ABNORMAL HIGH (ref 70–99)
Potassium: 4.6 mEq/L (ref 3.5–5.1)
Sodium: 141 mEq/L (ref 135–145)
Total Bilirubin: 0.7 mg/dL (ref 0.2–1.2)
Total Protein: 6.7 g/dL (ref 6.0–8.3)

## 2020-05-23 LAB — CBC WITH DIFFERENTIAL/PLATELET
Basophils Absolute: 0.1 10*3/uL (ref 0.0–0.1)
Basophils Relative: 0.9 % (ref 0.0–3.0)
Eosinophils Absolute: 0.3 10*3/uL (ref 0.0–0.7)
Eosinophils Relative: 3.8 % (ref 0.0–5.0)
HCT: 42.8 % (ref 36.0–46.0)
Hemoglobin: 14.2 g/dL (ref 12.0–15.0)
Lymphocytes Relative: 22.4 % (ref 12.0–46.0)
Lymphs Abs: 1.5 10*3/uL (ref 0.7–4.0)
MCHC: 33.3 g/dL (ref 30.0–36.0)
MCV: 89.9 fl (ref 78.0–100.0)
Monocytes Absolute: 0.5 10*3/uL (ref 0.1–1.0)
Monocytes Relative: 7.1 % (ref 3.0–12.0)
Neutro Abs: 4.4 10*3/uL (ref 1.4–7.7)
Neutrophils Relative %: 65.8 % (ref 43.0–77.0)
Platelets: 234 10*3/uL (ref 150.0–400.0)
RBC: 4.76 Mil/uL (ref 3.87–5.11)
RDW: 13.8 % (ref 11.5–15.5)
WBC: 6.7 10*3/uL (ref 4.0–10.5)

## 2020-05-23 LAB — LIPID PANEL
Cholesterol: 213 mg/dL — ABNORMAL HIGH (ref 0–200)
HDL: 47.3 mg/dL (ref >39.00)
LDL Cholesterol: 126 mg/dL — ABNORMAL HIGH (ref 0–99)
NonHDL: 165.8
Total CHOL/HDL Ratio: 5
Triglycerides: 197 mg/dL — ABNORMAL HIGH (ref 0.0–149.0)
VLDL: 39.4 mg/dL (ref 0.0–40.0)

## 2020-05-23 LAB — TSH: TSH: 0.34 u[IU]/mL — ABNORMAL LOW (ref 0.35–4.50)

## 2020-05-23 MED ORDER — LEVOTHYROXINE SODIUM 137 MCG PO TABS: 137.0000 ug | ORAL_TABLET | Freq: Every day | ORAL | 0 refills | Status: DC

## 2020-05-23 MED ORDER — LOSARTAN POTASSIUM 50 MG PO TABS
50.0000 mg | ORAL_TABLET | Freq: Every day | ORAL | 3 refills | Status: AC
Start: 2020-05-23 — End: ?

## 2020-05-23 NOTE — Telephone Encounter (Signed)
Spoke to patients daugther [name] and informed of labs. Patient is on too much synthroid. Will decrease to 137 mcg and have her follow up in 6 weeks for repeat TSH

## 2020-05-23 NOTE — Addendum Note (Signed)
Addended by: Jenness Corner L on: 05/23/2020 10:20 AM   Modules accepted: Orders

## 2020-05-23 NOTE — Patient Instructions (Signed)
It was great seeing you today   We will follow up with you about your blood work   Please work on walking more often   I will see you back in one year or sooner if needed

## 2020-05-23 NOTE — Progress Notes (Signed)
Subjective:    Patient ID: Doris Gray, female    DOB: 10-21-1930, 87 y.o.   MRN: [id number]  HPI Patient presents for yearly preventative medicine examination. She is a pleasant 87 year old female who  has a past medical history of CAD (coronary artery disease), Chicken pox, Colon polyps, Emphysema of lung ([place]), History of thyroid surgery, Hypertension, Hypothyroidism, Melanoma ([place]), Nodule of right lung, and Splenic artery aneurysm ([place]).  She resides at assisted living facility.  Her daughter is with her today.  History of postsurgical hypothyroidism-Synthroid 150 mcg daily.  Feels well controlled on this medication but his daughter reports that she has noticed that her mother is sweating more often.  Patient reports " I have always been sweaty"   Hypertension-controlled on Cozaar 50 mg daily.  She denies dizziness, lightheadedness, chest pain, shortness of breath.  Hyperlipidemia -intolerant to statin medication Lab Results  Component Value Date   CHOL 221 (H) 02/22/2019   HDL 50.90 02/22/2019   LDLCALC 134 (H) 02/22/2019   TRIG 180.0 (H) 02/22/2019   CHOLHDL 4 02/22/2019   H/o Splenic Artery Aneurysm -was treated in [place] previously.  Is currently being seen by Dr. Oneida Alar with vascular surgery.  She was last seen in August 2021.  At this office visit she had an ultrasound of her abdomen and was unable to visualize a splenic artery aneurysm.  There was flow on the proximal distal aspect of the artery which was not aneurysmal.  She denies abdominal pain  All immunizations and health maintenance protocols were reviewed with the patient and needed orders were placed. She is due for a flu vaccination   Appropriate screening laboratory values were ordered for the patient including screening of hyperlipidemia, renal function and hepatic function.  Medication reconciliation,  past medical history, social history, problem list and allergies were reviewed in detail with the  patient  Goals were established with regard to weight loss, exercise, and  diet in compliance with medications  Wt Readings from Last 3 Encounters:  05/23/20 192 lb (87.1 kg)  12/27/19 193 lb (87.5 kg)  12/04/19 196 lb (88.9 kg)    End of life planning was discussed.  She has no acute complaints.  She is sleeping well and her appetite is good   Review of Systems  Constitutional: Negative.   HENT: Negative.   Eyes: Negative.   Respiratory: Negative.   Cardiovascular: Negative.   Gastrointestinal: Negative.   Endocrine:       Increased sweating   Genitourinary: Negative.   Musculoskeletal: Negative.   Skin: Negative.   Allergic/Immunologic: Negative.   Neurological: Negative.   Hematological: Negative.   Psychiatric/Behavioral: Negative.    Past Medical History:  Diagnosis Date  . CAD (coronary artery disease)   . Chicken pox   . Colon polyps   . Emphysema of lung ([place])   . History of thyroid surgery   . Hypertension   . Hypothyroidism   . Melanoma ([place])   . Nodule of right lung    calcified granuloma 2.6 mm on CT from 2017   . Splenic artery aneurysm ([name])    2.6 cm     Social History   Socioeconomic History  . Marital status: Widowed    Spouse name: Not on file  . Number of children: Not on file  . Years of education: Not on file  . Highest education level: Bachelor's degree (e.g., BA, AB, BS)  Occupational History  . Occupation: Retired  Tobacco Use  .  Smoking status: Former Research scientist (life sciences)  . Smokeless tobacco: Never Used  Vaping Use  . Vaping Use: Never used  Substance and Sexual Activity  . Alcohol use: Yes    Comment: Glass of wine occasionally  . Drug use: Never  . Sexual activity: Not on file  Other Topics Concern  . Not on file  Social History Narrative  . Not on file   Social Determinants of Health   Financial Resource Strain: Not on file  Food Insecurity: Not on file  Transportation Needs: Not on file  Physical Activity: Not on file   Stress: Not on file  Social Connections: Not on file  Intimate Partner Violence: Not on file    Past Surgical History:  Procedure Laterality Date  . OTHER SURGICAL HISTORY     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  . THYROID SURGERY    . TONSILLECTOMY AND ADENOIDECTOMY      Family History  Adopted: Yes    Allergies  Allergen Reactions  . Amlodipine Other (See Comments)    Pt denies   . Codeine   . Statins     Myalgia   . Sulfa Antibiotics   . Sulfamethoxazole-Trimethoprim Diarrhea    AKA Bactrim  . Sulfasalazine Other (See Comments) and Diarrhea  . Tetanus Toxoid Swelling    Had arm swelling  Pt denies    Current Outpatient Medications on File Prior to Visit  Medication Sig Dispense Refill  . fexofenadine (ALLEGRA) 180 MG tablet Take 180 mg by mouth daily.    Marland Kitchen levothyroxine (SYNTHROID) 150 MCG tablet Take 1 tablet (150 mcg total) by mouth daily. 90 tablet 3  . losartan (COZAAR) 50 MG tablet Take 1 tablet (50 mg total) by mouth daily. 90 tablet 3   No current facility-administered medications on file prior to visit.    BP 120/80   Temp 98 F (36.7 C)   Ht 5' 2.5" (1.588 m) Comment: WITH SHOES  Wt 192 lb (87.1 kg)   BMI 34.56 kg/m      Objective:   Physical Exam Vitals and nursing note reviewed.  Constitutional:      General: She is not in acute distress.    Appearance: Normal appearance. She is well-developed. She is not ill-appearing.  HENT:     Head: Normocephalic and atraumatic.     Right Ear: Tympanic membrane, ear canal and external ear normal. There is no impacted cerumen.     Left Ear: Tympanic membrane, ear canal and external ear normal. There is no impacted cerumen.     Nose: Nose normal. No congestion or rhinorrhea.     Mouth/Throat:     Mouth: Mucous membranes are moist.     Pharynx: Oropharynx is clear. No oropharyngeal exudate or posterior oropharyngeal erythema.  Eyes:     General:        Right eye: No discharge.        Left eye: No discharge.      Extraocular Movements: Extraocular movements intact.     Conjunctiva/sclera: Conjunctivae normal.     Pupils: Pupils are equal, round, and reactive to light.  Neck:     Thyroid: No thyromegaly.     Vascular: No carotid bruit.     Trachea: No tracheal deviation.  Cardiovascular:     Rate and Rhythm: Normal rate and regular rhythm.     Pulses: Normal pulses.     Heart sounds: Normal heart sounds. No murmur heard. No friction rub. No gallop.   Pulmonary:  Effort: Pulmonary effort is normal. No respiratory distress.     Breath sounds: Normal breath sounds. No stridor. No wheezing, rhonchi or rales.  Chest:     Chest wall: No tenderness.  Abdominal:     General: Abdomen is flat. Bowel sounds are normal. There is no distension.     Palpations: Abdomen is soft. There is no mass.     Tenderness: There is no abdominal tenderness. There is no right CVA tenderness, left CVA tenderness, guarding or rebound.     Hernia: No hernia is present.  Musculoskeletal:        General: No swelling, tenderness, deformity or signs of injury. Normal range of motion.     Cervical back: Normal range of motion and neck supple.     Right lower leg: No edema.     Left lower leg: No edema.  Lymphadenopathy:     Cervical: No cervical adenopathy.  Skin:    General: Skin is warm and dry.     Coloration: Skin is not jaundiced or pale.     Findings: No bruising, erythema, lesion or rash.  Neurological:     General: No focal deficit present.     Mental Status: She is alert and oriented to person, place, and time.     Cranial Nerves: No cranial nerve deficit.     Sensory: No sensory deficit.     Motor: No weakness.     Coordination: Coordination normal.     Gait: Gait normal.     Deep Tendon Reflexes: Reflexes normal.  Psychiatric:        Mood and Affect: Mood normal.        Behavior: Behavior normal.        Thought Content: Thought content normal.        Judgment: Judgment normal.       Assessment &  Plan:  1. Hypothyroidism, unspecified type - Consider dose change of synthroid d/t increase sweating.  - CBC with Differential/Platelet; Future - Comprehensive metabolic panel; Future - Lipid panel; Future - TSH; Future  2. Essential hypertension - Well controlled. No change  - CBC with Differential/Platelet; Future - Comprehensive metabolic panel; Future - Lipid panel; Future - TSH; Future - losartan (COZAAR) 50 MG tablet; Take 1 tablet (50 mg total) by mouth daily.  Dispense: 90 tablet; Refill: 3  3. Splenic artery aneurysm ([name]) - Asymptomatic  - No longer needs to be seen by vascular unless she has issues  - CBC with Differential/Platelet; Future - Comprehensive metabolic panel; Future - Lipid panel; Future - TSH; Future  4. Mixed hyperlipidemia - Consider zetia?  - CBC with Differential/Platelet; Future - Comprehensive metabolic panel; Future - Lipid panel; Future - TSH; Future  Dorothyann Peng, NP

## 2020-05-23 NOTE — Addendum Note (Signed)
Addended by: Miles Costain T on: 05/23/2020 10:11 AM   Modules accepted: Orders

## 2020-08-06 ENCOUNTER — Telehealth: Payer: Self-pay | Admitting: Adult Health

## 2020-08-06 NOTE — Telephone Encounter (Signed)
Left message for patient to call back and schedule Medicare Annual Wellness Visit (AWV) either virtually or in office. No detailed message left   AWV-I per [place] 05/10/18  please schedule at anytime with [facility] Nurse Health Advisor 1 or 2   This should be a 45 minute visit.

## 2020-08-12 ENCOUNTER — Other Ambulatory Visit: Payer: Self-pay

## 2020-08-12 ENCOUNTER — Other Ambulatory Visit (INDEPENDENT_AMBULATORY_CARE_PROVIDER_SITE_OTHER): Payer: Medicare Other

## 2020-08-12 DIAGNOSIS — E039 Hypothyroidism, unspecified: Secondary | ICD-10-CM | POA: Diagnosis not present

## 2020-08-13 ENCOUNTER — Other Ambulatory Visit: Payer: Self-pay | Admitting: Adult Health

## 2020-08-13 DIAGNOSIS — E039 Hypothyroidism, unspecified: Secondary | ICD-10-CM

## 2020-08-13 LAB — TSH: TSH: 2.44 u[IU]/mL (ref 0.35–4.50)

## 2020-08-13 MED ORDER — LEVOTHYROXINE SODIUM 137 MCG PO TABS: 137.0000 ug | ORAL_TABLET | Freq: Every day | ORAL | 3 refills | Status: DC

## 2020-10-09 ENCOUNTER — Telehealth: Payer: Self-pay | Admitting: Adult Health

## 2020-10-09 NOTE — Telephone Encounter (Signed)
Left message for patient to call back and schedule Medicare Annual Wellness Visit (AWV) either virtually or in office.   AWV-I per [place] 05/10/18  please schedule at anytime with [facility] Nurse Health Advisor 1 or 2   This should be a 45 minute visit.

## 2020-10-28 ENCOUNTER — Encounter: Payer: Self-pay | Admitting: Adult Health

## 2020-11-17 ENCOUNTER — Other Ambulatory Visit: Payer: Self-pay

## 2020-11-18 ENCOUNTER — Ambulatory Visit (INDEPENDENT_AMBULATORY_CARE_PROVIDER_SITE_OTHER): Payer: Medicare Other | Admitting: Adult Health

## 2020-11-18 ENCOUNTER — Encounter: Payer: Self-pay | Admitting: Adult Health

## 2020-11-18 VITALS — BP 120/64 | HR 67 | Temp 97.5°F | Ht 62.5 in | Wt 186.0 lb

## 2020-11-18 DIAGNOSIS — Z117 Encounter for testing for latent tuberculosis infection: Secondary | ICD-10-CM

## 2020-11-18 DIAGNOSIS — Z593 Problems related to living in residential institution: Secondary | ICD-10-CM

## 2020-11-18 NOTE — Addendum Note (Signed)
Addended by: Amanda Cockayne on: 11/18/2020 03:43 PM   Modules accepted: Orders

## 2020-11-18 NOTE — Progress Notes (Signed)
Subjective:    Patient ID: Doris Gray, female    DOB: 1931-01-20, 87 y.o.   MRN: [id number]  HPI  87 year old female who  has a past medical history of CAD (coronary artery disease), Chicken pox, Colon polyps, Emphysema of lung ([place]), History of thyroid surgery, Hypertension, Hypothyroidism, Melanoma ([place]), Nodule of right lung, and Splenic artery aneurysm ([place]).  She presents to the office today with her daughter. She will be staying at her same facility but will moving to assisted living.   She needs FL2 filled out and TB testing done.   Review of Systems See HPI   Past Medical History:  Diagnosis Date   CAD (coronary artery disease)    Chicken pox    Colon polyps    Emphysema of lung ([place])    History of thyroid surgery    Hypertension    Hypothyroidism    Melanoma ([place])    Nodule of right lung    calcified granuloma 2.6 mm on CT from 2017    Splenic artery aneurysm ([name])    2.6 cm     Social History   Socioeconomic History   Marital status: Widowed    Spouse name: Not on file   Number of children: Not on file   Years of education: Not on file   Highest education level: Bachelor's degree (e.g., BA, AB, BS)  Occupational History   Occupation: Retired  Tobacco Use   Smoking status: Former    Pack years: 0.00   Smokeless tobacco: Never  Vaping Use   Vaping Use: Never used  Substance and Sexual Activity   Alcohol use: Yes    Comment: Glass of wine occasionally   Drug use: Never   Sexual activity: Not on file  Other Topics Concern   Not on file  Social History Narrative   Not on file   Social Determinants of Health   Financial Resource Strain: Not on file  Food Insecurity: Not on file  Transportation Needs: Not on file  Physical Activity: Not on file  Stress: Not on file  Social Connections: Not on file  Intimate Partner Violence: Not on file    Past Surgical History:  Procedure Laterality Date   OTHER SURGICAL HISTORY     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    THYROID SURGERY     TONSILLECTOMY AND ADENOIDECTOMY      Family History  Adopted: Yes    Allergies  Allergen Reactions   Amlodipine Other (See Comments)    Pt denies    Codeine    Statins     Myalgia    Sulfa Antibiotics    Sulfamethoxazole-Trimethoprim Diarrhea    AKA Bactrim   Sulfasalazine Other (See Comments) and Diarrhea   Tetanus Toxoid Swelling    Had arm swelling  Pt denies    Current Outpatient Medications on File Prior to Visit  Medication Sig Dispense Refill   fexofenadine (ALLEGRA) 180 MG tablet Take 180 mg by mouth daily.     levothyroxine (SYNTHROID) 137 MCG tablet Take 1 tablet (137 mcg total) by mouth daily. 90 tablet 3   losartan (COZAAR) 50 MG tablet Take 1 tablet (50 mg total) by mouth daily. 90 tablet 3   No current facility-administered medications on file prior to visit.    BP 120/64   Pulse 67   Temp (!) 97.5 F (36.4 C) (Oral)   Ht 5' 2.5" (1.588 m)   Wt 186 lb (84.4 kg)   SpO2 96%  BMI 33.48 kg/m       Objective:   Physical Exam Vitals and nursing note reviewed.  Constitutional:      Appearance: Normal appearance.  Cardiovascular:     Rate and Rhythm: Normal rate and regular rhythm.     Pulses: Normal pulses.     Heart sounds: Normal heart sounds.  Pulmonary:     Effort: Pulmonary effort is normal.     Breath sounds: Normal breath sounds.  Musculoskeletal:        General: Normal range of motion.  Skin:    General: Skin is warm and dry.     Capillary Refill: Capillary refill takes less than 2 seconds.  Neurological:     General: No focal deficit present.     Mental Status: She is alert and oriented to person, place, and time.  Psychiatric:        Mood and Affect: Mood normal.        Behavior: Behavior normal.      Assessment & Plan:   1. Lives in assisted living facility - Will fill out [facility] and additional forms for the patient   2. Encounter for testing for latent tuberculosis  - QuantiFERON-TB Gold Plus;  Future  Dorothyann Peng, NP

## 2020-11-19 LAB — QUANTIFERON-TB GOLD PLUS

## 2020-11-20 ENCOUNTER — Other Ambulatory Visit: Payer: Self-pay | Admitting: Adult Health

## 2020-11-20 DIAGNOSIS — Z117 Encounter for testing for latent tuberculosis infection: Secondary | ICD-10-CM

## 2020-11-20 NOTE — Progress Notes (Signed)
Encounter for TB testing

## 2020-11-24 ENCOUNTER — Other Ambulatory Visit: Payer: Self-pay

## 2020-11-24 ENCOUNTER — Other Ambulatory Visit (INDEPENDENT_AMBULATORY_CARE_PROVIDER_SITE_OTHER): Payer: Medicare Other

## 2020-11-24 DIAGNOSIS — Z117 Encounter for testing for latent tuberculosis infection: Secondary | ICD-10-CM

## 2020-11-26 LAB — QUANTIFERON-TB GOLD PLUS
Mitogen-NIL: >10 IU/mL
NIL: 0.03 IU/mL
QuantiFERON-TB Gold Plus: NEGATIVE
TB1-NIL: <0 IU/mL
TB2-NIL: <0 IU/mL

## 2020-11-28 ENCOUNTER — Encounter: Payer: Self-pay | Admitting: Adult Health

## 2021-04-07 ENCOUNTER — Observation Stay (HOSPITAL_COMMUNITY): Payer: Medicare Other

## 2021-04-07 ENCOUNTER — Encounter (HOSPITAL_BASED_OUTPATIENT_CLINIC_OR_DEPARTMENT_OTHER): Payer: Self-pay | Admitting: Emergency Medicine

## 2021-04-07 ENCOUNTER — Emergency Department (HOSPITAL_BASED_OUTPATIENT_CLINIC_OR_DEPARTMENT_OTHER): Payer: Medicare Other

## 2021-04-07 ENCOUNTER — Inpatient Hospital Stay (HOSPITAL_BASED_OUTPATIENT_CLINIC_OR_DEPARTMENT_OTHER)
Admission: EM | Admit: 2021-04-07 | Discharge: 2021-04-14 | DRG: 865 | Disposition: A | Discharge status: Nursing Home (Includes ICF) | Payer: Medicare Other | Source: Skilled Nursing Facility | Attending: Internal Medicine | Admitting: Internal Medicine

## 2021-04-07 ENCOUNTER — Other Ambulatory Visit: Payer: Self-pay

## 2021-04-07 DIAGNOSIS — Z87891 Personal history of nicotine dependence: Secondary | ICD-10-CM

## 2021-04-07 DIAGNOSIS — I728 Aneurysm of other specified arteries: Secondary | ICD-10-CM | POA: Diagnosis present

## 2021-04-07 DIAGNOSIS — Z79899 Other long term (current) drug therapy: Secondary | ICD-10-CM

## 2021-04-07 DIAGNOSIS — J1081 Influenza due to other identified influenza virus with encephalopathy: Secondary | ICD-10-CM | POA: Diagnosis not present

## 2021-04-07 DIAGNOSIS — J101 Influenza due to other identified influenza virus with other respiratory manifestations: Secondary | ICD-10-CM

## 2021-04-07 DIAGNOSIS — F0390 Unspecified dementia without behavioral disturbance: Secondary | ICD-10-CM | POA: Diagnosis present

## 2021-04-07 DIAGNOSIS — Z20822 Contact with and (suspected) exposure to covid-19: Secondary | ICD-10-CM | POA: Diagnosis present

## 2021-04-07 DIAGNOSIS — R7401 Elevation of levels of liver transaminase levels: Secondary | ICD-10-CM

## 2021-04-07 DIAGNOSIS — N3000 Acute cystitis without hematuria: Secondary | ICD-10-CM

## 2021-04-07 DIAGNOSIS — Z882 Allergy status to sulfonamides status: Secondary | ICD-10-CM

## 2021-04-07 DIAGNOSIS — E669 Obesity, unspecified: Secondary | ICD-10-CM | POA: Diagnosis present

## 2021-04-07 DIAGNOSIS — Z781 Physical restraint status: Secondary | ICD-10-CM

## 2021-04-07 DIAGNOSIS — G9341 Metabolic encephalopathy: Secondary | ICD-10-CM | POA: Diagnosis present

## 2021-04-07 DIAGNOSIS — G9340 Encephalopathy, unspecified: Secondary | ICD-10-CM | POA: Diagnosis not present

## 2021-04-07 DIAGNOSIS — B9620 Unspecified Escherichia coli [E. coli] as the cause of diseases classified elsewhere: Secondary | ICD-10-CM | POA: Diagnosis present

## 2021-04-07 DIAGNOSIS — N390 Urinary tract infection, site not specified: Secondary | ICD-10-CM | POA: Diagnosis not present

## 2021-04-07 DIAGNOSIS — E530 Riboflavin deficiency: Secondary | ICD-10-CM | POA: Diagnosis present

## 2021-04-07 DIAGNOSIS — Z885 Allergy status to narcotic agent status: Secondary | ICD-10-CM

## 2021-04-07 DIAGNOSIS — Z1619 Resistance to other specified beta lactam antibiotics: Secondary | ICD-10-CM | POA: Diagnosis present

## 2021-04-07 DIAGNOSIS — E039 Hypothyroidism, unspecified: Secondary | ICD-10-CM | POA: Diagnosis present

## 2021-04-07 DIAGNOSIS — J439 Emphysema, unspecified: Secondary | ICD-10-CM | POA: Diagnosis present

## 2021-04-07 DIAGNOSIS — Z888 Allergy status to other drugs, medicaments and biological substances status: Secondary | ICD-10-CM

## 2021-04-07 DIAGNOSIS — Z79890 Hormone replacement therapy (postmenopausal): Secondary | ICD-10-CM

## 2021-04-07 DIAGNOSIS — Z66 Do not resuscitate: Secondary | ICD-10-CM | POA: Diagnosis present

## 2021-04-07 DIAGNOSIS — I10 Essential (primary) hypertension: Secondary | ICD-10-CM | POA: Diagnosis present

## 2021-04-07 DIAGNOSIS — Z6835 Body mass index (BMI) 35.0-35.9, adult: Secondary | ICD-10-CM

## 2021-04-07 DIAGNOSIS — Z85820 Personal history of malignant melanoma of skin: Secondary | ICD-10-CM

## 2021-04-07 DIAGNOSIS — I2510 Atherosclerotic heart disease of native coronary artery without angina pectoris: Secondary | ICD-10-CM | POA: Diagnosis present

## 2021-04-07 DIAGNOSIS — K760 Fatty (change of) liver, not elsewhere classified: Secondary | ICD-10-CM | POA: Diagnosis present

## 2021-04-07 HISTORY — DX: Urinary tract infection, site not specified: N39.0

## 2021-04-07 HISTORY — DX: Encephalopathy, unspecified: G93.40

## 2021-04-07 HISTORY — DX: Acute cystitis without hematuria: N30.00

## 2021-04-07 HISTORY — DX: Influenza due to other identified influenza virus with other respiratory manifestations: J10.1

## 2021-04-07 LAB — COMPREHENSIVE METABOLIC PANEL
ALT: 48 U/L — ABNORMAL HIGH (ref 0–44)
AST: 59 U/L — ABNORMAL HIGH (ref 15–41)
Albumin: 4.8 g/dL (ref 3.5–5.0)
Alkaline Phosphatase: 70 U/L (ref 38–126)
Anion gap: 11 (ref 5–15)
BUN: 15 mg/dL (ref 8–23)
CO2: 24 mmol/L (ref 22–32)
Calcium: 10.8 mg/dL — ABNORMAL HIGH (ref 8.9–10.3)
Chloride: 100 mmol/L (ref 98–111)
Creatinine, Ser: 0.93 mg/dL (ref 0.44–1.00)
GFR, Estimated: 59 mL/min — ABNORMAL LOW (ref >60)
Glucose, Bld: 121 mg/dL — ABNORMAL HIGH (ref 70–99)
Potassium: 4.3 mmol/L (ref 3.5–5.1)
Sodium: 135 mmol/L (ref 135–145)
Total Bilirubin: 0.5 mg/dL (ref 0.3–1.2)
Total Protein: 7.6 g/dL (ref 6.5–8.1)

## 2021-04-07 LAB — CBG MONITORING, ED: Glucose-Capillary: 98 mg/dL (ref 70–99)

## 2021-04-07 LAB — URINALYSIS, ROUTINE W REFLEX MICROSCOPIC
Bilirubin Urine: NEGATIVE
Glucose, UA: NEGATIVE mg/dL
Ketones, ur: NEGATIVE mg/dL
Nitrite: POSITIVE — AB
Protein, ur: NEGATIVE mg/dL
Specific Gravity, Urine: 1.014 (ref 1.005–1.030)
pH: 5.5 (ref 5.0–8.0)

## 2021-04-07 LAB — CBC
HCT: 46.7 % — ABNORMAL HIGH (ref 36.0–46.0)
HCT: 42.5 % (ref 36.0–46.0)
Hemoglobin: 15.2 g/dL — ABNORMAL HIGH (ref 12.0–15.0)
Hemoglobin: 13.8 g/dL (ref 12.0–15.0)
MCH: 29.1 pg (ref 26.0–34.0)
MCH: 29.5 pg (ref 26.0–34.0)
MCHC: 32.5 g/dL (ref 30.0–36.0)
MCHC: 32.5 g/dL (ref 30.0–36.0)
MCV: 89.5 fL (ref 80.0–100.0)
MCV: 90.8 fL (ref 80.0–100.0)
Platelets: 192 10*3/uL (ref 150–400)
Platelets: 159 10*3/uL (ref 150–400)
RBC: 5.22 MIL/uL — ABNORMAL HIGH (ref 3.87–5.11)
RBC: 4.68 MIL/uL (ref 3.87–5.11)
RDW: 13.7 % (ref 11.5–15.5)
RDW: 13.8 % (ref 11.5–15.5)
WBC: 7.6 10*3/uL (ref 4.0–10.5)
WBC: 6 10*3/uL (ref 4.0–10.5)
nRBC: 0 % (ref 0.0–0.2)
nRBC: 0 % (ref 0.0–0.2)

## 2021-04-07 LAB — RESP PANEL BY RT-PCR (FLU A&B, COVID) ARPGX2
Influenza A by PCR: POSITIVE — AB
Influenza B by PCR: NEGATIVE
SARS Coronavirus 2 by RT PCR: NEGATIVE

## 2021-04-07 LAB — PROTIME-INR
INR: 1.1 (ref 0.8–1.2)
Prothrombin Time: 13.9 seconds (ref 11.4–15.2)

## 2021-04-07 LAB — CREATININE, SERUM
Creatinine, Ser: 0.88 mg/dL (ref 0.44–1.00)
GFR, Estimated: >60 mL/min (ref >60)

## 2021-04-07 LAB — MAGNESIUM: Magnesium: 2.2 mg/dL (ref 1.7–2.4)

## 2021-04-07 LAB — LACTIC ACID, PLASMA
Lactic Acid, Venous: 1.7 mmol/L (ref 0.5–1.9)
Lactic Acid, Venous: 1.7 mmol/L (ref 0.5–1.9)

## 2021-04-07 LAB — APTT: aPTT: 24 seconds (ref 24–36)

## 2021-04-07 MED ORDER — ACETAMINOPHEN 325 MG PO TABS
650.0000 mg | ORAL_TABLET | Freq: Once | ORAL | Status: AC
  Administered 2021-04-07: 650 mg via ORAL

## 2021-04-07 MED ORDER — LACTATED RINGERS IV SOLN: INTRAVENOUS | Status: AC

## 2021-04-07 MED ORDER — OSELTAMIVIR PHOSPHATE 30 MG PO CAPS
30.0000 mg | ORAL_CAPSULE | Freq: Once | ORAL | Status: AC
  Administered 2021-04-07: 30 mg via ORAL

## 2021-04-07 MED ORDER — ENOXAPARIN SODIUM 40 MG/0.4ML IJ SOSY
40.0000 mg | PREFILLED_SYRINGE | INTRAMUSCULAR | Status: DC
  Administered 2021-04-08 – 2021-04-14 (×6): 40 mg via SUBCUTANEOUS
  Filled 2021-04-07 (×7): qty 0.4

## 2021-04-07 MED ORDER — OSELTAMIVIR PHOSPHATE 30 MG PO CAPS
30.0000 mg | ORAL_CAPSULE | Freq: Two times a day (BID) | ORAL | Status: DC
  Filled 2021-04-07: qty 1

## 2021-04-07 MED ORDER — OSELTAMIVIR PHOSPHATE 30 MG PO CAPS
30.0000 mg | ORAL_CAPSULE | Freq: Two times a day (BID) | ORAL | Status: DC
  Filled 2021-04-07 (×2): qty 1

## 2021-04-07 MED ORDER — ACETAMINOPHEN 325 MG PO TABS: 650.0000 mg | ORAL_TABLET | Freq: Four times a day (QID) | ORAL | Status: DC | PRN

## 2021-04-07 MED ORDER — SODIUM CHLORIDE 0.9 % IV SOLN
2.0000 g | INTRAVENOUS | Status: DC
  Administered 2021-04-08: 2 g via INTRAVENOUS
  Filled 2021-04-07 (×2): qty 20

## 2021-04-07 MED ORDER — LEVOTHYROXINE SODIUM 100 MCG/5ML IV SOLN
68.5000 ug | Freq: Every day | INTRAVENOUS | Status: DC
  Administered 2021-04-08 – 2021-04-09 (×2): 68.5 ug via INTRAVENOUS
  Filled 2021-04-07 (×2): qty 5

## 2021-04-07 MED ORDER — SODIUM CHLORIDE 0.9 % IV SOLN
500.0000 mg | INTRAVENOUS | Status: DC
  Administered 2021-04-08 – 2021-04-09 (×2): 500 mg via INTRAVENOUS
  Filled 2021-04-07 (×3): qty 500

## 2021-04-07 MED ORDER — LACTATED RINGERS IV BOLUS
1000.0000 mL | Freq: Once | INTRAVENOUS | Status: AC
  Administered 2021-04-07: 1000 mL via INTRAVENOUS

## 2021-04-07 MED ORDER — LACTATED RINGERS IV BOLUS (SEPSIS)
1000.0000 mL | Freq: Once | INTRAVENOUS | Status: AC
  Administered 2021-04-07: 1000 mL via INTRAVENOUS

## 2021-04-07 MED ORDER — SODIUM CHLORIDE 0.9 % IV SOLN
2.0000 g | Freq: Once | INTRAVENOUS | Status: AC
  Administered 2021-04-07: 2 g via INTRAVENOUS

## 2021-04-07 MED ORDER — LORAZEPAM 2 MG/ML IJ SOLN
1.0000 mg | Freq: Once | INTRAMUSCULAR | Status: AC
  Administered 2021-04-07: 1 mg via INTRAVENOUS

## 2021-04-07 MED ORDER — HYDRALAZINE HCL 20 MG/ML IJ SOLN
10.0000 mg | INTRAMUSCULAR | Status: DC | PRN
  Filled 2021-04-07: qty 1

## 2021-04-07 MED ORDER — ACETAMINOPHEN 650 MG RE SUPP: 650.0000 mg | Freq: Four times a day (QID) | RECTAL | Status: DC | PRN

## 2021-04-07 NOTE — ED Notes (Signed)
Handoff report given to [facility] 

## 2021-04-07 NOTE — Progress Notes (Signed)
82 F with past medical history of hypothyroidism, hypertension presented to the med Center at [facility] with complaints of fever, altered mental status.  She was brought by her daughter.  She also got very weak and was unable to ambulate for the last 3 days.  Also reported cough.  Chest x-ray did not show any acute findings.  CT head did not show any acute findings either.  UA was suspicious for UTI with large leukocytes, elevated white cells, positive nitrate.  Patient was started on ceftriaxone.  Influenza A also comes out to be  positive.

## 2021-04-07 NOTE — ED Triage Notes (Signed)
Pt arrives pov with daughter, daughter reports fever, AMS and difficulty walking x 3 days. Also reports cough. Endorses concern for UTI. Pt has alzheimer's

## 2021-04-07 NOTE — ED Notes (Addendum)
Patient confused.   Became agitated attempt to get out of bed and pulling on lines.  When redirected by staff started to hit out; kick and bite staff.  ED doctor [facility] notified orders received for medication and soft restraints. Moved to more visible room to better watch patient.

## 2021-04-07 NOTE — ED Notes (Signed)
Daughter present and at bedside.

## 2021-04-07 NOTE — ED Provider Notes (Signed)
[facility] EMERGENCY DEPT Provider Note   CSN: [id number] Arrival date & time: 04/07/21  1056     History Chief Complaint  Patient presents with   Altered Mental Status    Doris Gray is a 87 y.o. female.   Altered Mental Status Patient with history of dementia, CAD, emphysema, hypothyroidism, HTN, melanoma, presenting for fever and altered mental status.  She arrives with her daughter.  Her daughter provides the history.  Patient is currently on day 3 of symptoms.  Daughter states that no interventions have been taken at her nursing facility.  Her altered mental status seems to worsen today.  She is not aware of any vomiting or diarrhea.  Patient has had a cough.  At baseline, patient has memory loss but is able to engage in conversation.  Currently, she is only speaking nonsensically.  She has also had odd repetitive tongue movements which are new.    Past Medical History:  Diagnosis Date   CAD (coronary artery disease)    Chicken pox    Colon polyps    Emphysema of lung ([place])    History of thyroid surgery    Hypertension    Hypothyroidism    Melanoma ([place])    Nodule of right lung    calcified granuloma 2.6 mm on CT from 2017    Splenic artery aneurysm ([name])    2.6 cm     Patient Active Problem List   Diagnosis Date Noted   UTI (urinary tract infection) 04/07/2021   Acute encephalopathy 04/07/2021   Acute cystitis without hematuria 04/07/2021   Influenza A 04/07/2021   Splenic artery aneurysm ([name])    Hypertension    Hypothyroidism    Melanoma ([place])     Past Surgical History:  Procedure Laterality Date   OTHER SURGICAL HISTORY     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   THYROID SURGERY     TONSILLECTOMY AND ADENOIDECTOMY       OB History   No obstetric history on file.     Family History  Adopted: Yes    Social History   Tobacco Use   Smoking status: Former   Smokeless tobacco: Never  Scientific laboratory technician Use: Never used  Substance Use Topics    Alcohol use: Not Currently    Comment: Glass of wine occasionally   Drug use: Never    Home Medications Prior to Admission medications   Medication Sig Start Date End Date Taking? Authorizing Provider  fexofenadine (ALLEGRA) 180 MG tablet Take 180 mg by mouth daily.   Yes [provider]  fluticasone (FLONASE) 50 MCG/ACT nasal spray Place 1 spray into both nostrils 2 (two) times daily.   Yes [provider]  guaiFENesin (MUCINEX) 600 MG 12 hr tablet Take 600 mg by mouth 2 (two) times daily.   Yes [provider]  levothyroxine (SYNTHROID) 137 MCG tablet Take 1 tablet (137 mcg total) by mouth daily. 08/13/20  Yes Nafziger, Tommi Rumps, NP  losartan (COZAAR) 50 MG tablet Take 1 tablet (50 mg total) by mouth daily. 05/23/20  Yes Nafziger, Tommi Rumps, NP    Allergies    Amlodipine, Codeine, Statins, Sulfa antibiotics, Sulfamethoxazole-trimethoprim, Sulfasalazine, and Tetanus toxoid  Review of Systems   Review of Systems  Unable to perform ROS: Dementia   Physical Exam Updated Vital Signs BP (!) 148/77 (BP Location: Left Arm)   Pulse 74   Temp (!) 97.4 F (36.3 C) (Oral)   Resp 20   Ht 5\' 3"  (1.6  m)   Wt 90.7 kg   SpO2 91%   BMI 35.43 kg/m   Physical Exam Vitals and nursing note reviewed.  Constitutional:      General: She is not in acute distress.    Appearance: Normal appearance. She is well-developed and normal weight. She is ill-appearing. She is not toxic-appearing or diaphoretic.  HENT:     Head: Normocephalic and atraumatic.     Right Ear: External ear normal.     Left Ear: External ear normal.     Nose: Congestion present.     Mouth/Throat:     Mouth: Mucous membranes are dry.     Pharynx: Oropharynx is clear.  Eyes:     General: No scleral icterus.    Extraocular Movements: Extraocular movements intact.     Conjunctiva/sclera: Conjunctivae normal.  Cardiovascular:     Rate and Rhythm: Normal rate and regular rhythm.     Heart sounds: No murmur  heard. Pulmonary:     Effort: Pulmonary effort is normal. No respiratory distress.     Breath sounds: Normal breath sounds. No wheezing, rhonchi or rales.     Comments: Cough is present Abdominal:     Palpations: Abdomen is soft.     Tenderness: There is no abdominal tenderness.  Musculoskeletal:        General: No swelling, tenderness or deformity.     Cervical back: Normal range of motion and neck supple. No rigidity.  Skin:    General: Skin is warm and dry.     Capillary Refill: Capillary refill takes less than 2 seconds.     Coloration: Skin is not jaundiced or pale.  Neurological:     Mental Status: She is disoriented.     Cranial Nerves: No cranial nerve deficit.     Sensory: No sensory deficit.     Motor: No weakness.  Psychiatric:        Mood and Affect: Mood normal.    ED Results / Procedures / Treatments   Labs (all labs ordered are listed, but only abnormal results are displayed) Labs Reviewed  RESP PANEL BY RT-PCR (FLU A&B, COVID) ARPGX2 - Abnormal; Notable for the following components:      Result Value   Influenza A by PCR POSITIVE (*)    All other components within normal limits  COMPREHENSIVE METABOLIC PANEL - Abnormal; Notable for the following components:   Glucose, Bld 121 (*)    Calcium 10.8 (*)    AST 59 (*)    ALT 48 (*)    GFR, Estimated 59 (*)    All other components within normal limits  CBC - Abnormal; Notable for the following components:   RBC 5.22 (*)    Hemoglobin 15.2 (*)    HCT 46.7 (*)    All other components within normal limits  URINALYSIS, ROUTINE W REFLEX MICROSCOPIC - Abnormal; Notable for the following components:   Hgb urine dipstick TRACE (*)    Nitrite POSITIVE (*)    Leukocytes,Ua LARGE (*)    Bacteria, UA RARE (*)    All other components within normal limits  CULTURE, BLOOD (ROUTINE X 2)  CULTURE, BLOOD (ROUTINE X 2)  MRSA NEXT GEN BY PCR, NASAL  URINE CULTURE  PROTIME-INR  LACTIC ACID, PLASMA  LACTIC ACID, PLASMA   APTT  MAGNESIUM  CBC  CREATININE, SERUM  CBC WITH DIFFERENTIAL/PLATELET  COMPREHENSIVE METABOLIC PANEL  HEPATITIS PANEL, ACUTE  CBG MONITORING, ED    EKG None  Radiology CT Head  Wo Contrast  Result Date: 04/07/2021 CLINICAL DATA:  Mental status change of unknown etiology. EXAM: CT HEAD WITHOUT CONTRAST TECHNIQUE: Contiguous axial images were obtained from the base of the skull through the vertex without intravenous contrast. COMPARISON:  None. FINDINGS: Brain: No evidence of acute infarction, hemorrhage, hydrocephalus, extra-axial collection or mass lesion/mass effect. There is moderate to severe diffuse low-attenuation within the subcortical and periventricular white matter compatible with chronic microvascular disease. Prominence of sulci and ventricles compatible with brain atrophy. Vascular: No hyperdense vessel or unexpected calcification. Skull: Normal. Negative for fracture or focal lesion. Sinuses/Orbits: No acute finding. Other: None IMPRESSION: 1. No acute intracranial abnormalities. 2. Chronic small vessel ischemic change and brain atrophy. Electronically Signed   By: Kerby Moors M.D.   On: 04/07/2021 13:23   DG Chest Port 1 View  Result Date: 04/07/2021 CLINICAL DATA:  Altered mental status and difficulty walking for 3 days. EXAM: PORTABLE CHEST 1 VIEW COMPARISON:  None. FINDINGS: The cardiac silhouette, mediastinal and hilar contours are within normal limits for age. The lungs are clear of an acute infiltrate. No pleural effusions or obvious pulmonary lesions. IMPRESSION: No acute cardiopulmonary findings. Electronically Signed   By: Marijo Sanes M.D.   On: 04/07/2021 12:46   US Abdomen Limited RUQ (LIVER/GB)  Result Date: 04/08/2021 CLINICAL DATA:  Elevated liver enzymes. EXAM: ULTRASOUND ABDOMEN LIMITED RIGHT UPPER QUADRANT COMPARISON:  CT abdomen pelvis dated 06/21/2018. FINDINGS: Gallbladder: Cholecystectomy. Common bile duct: Diameter: 11 mm. Liver: There is diffuse  increased liver echogenicity most commonly seen in the setting of fatty infiltration. Superimposed inflammation or fibrosis is not excluded. Clinical correlation is recommended. Portal vein is patent on color Doppler imaging with normal direction of blood flow towards the liver. Other: None. IMPRESSION: 1. Fatty liver. 2. Cholecystectomy. Electronically Signed   By: Anner Crete M.D.   On: 04/08/2021 00:27    Procedures Procedures   Medications Ordered in ED Medications  enoxaparin (LOVENOX) injection 40 mg (has no administration in time range)  acetaminophen (TYLENOL) tablet 650 mg (has no administration in time range)    Or  acetaminophen (TYLENOL) suppository 650 mg (has no administration in time range)  lactated ringers infusion ( Intravenous New Bag/Given 04/07/21 2333)  cefTRIAXone (ROCEPHIN) 2 g in sodium chloride 0.9 % 100 mL IVPB (has no administration in time range)  hydrALAZINE (APRESOLINE) injection 10 mg (has no administration in time range)  levothyroxine (SYNTHROID, LEVOTHROID) injection 68.5 mcg (has no administration in time range)  azithromycin (ZITHROMAX) 500 mg in sodium chloride 0.9 % 250 mL IVPB (500 mg Intravenous New Bag/Given 04/08/21 0225)  oseltamivir (TAMIFLU) capsule 30 mg (has no administration in time range)  lactated ringers bolus 1,000 mL (0 mLs Intravenous Stopped 04/07/21 1400)  acetaminophen (TYLENOL) tablet 650 mg (650 mg Oral Given 04/07/21 1229)  lactated ringers bolus 1,000 mL (0 mLs Intravenous Stopped 04/07/21 1655)  cefTRIAXone (ROCEPHIN) 2 g in sodium chloride 0.9 % 100 mL IVPB (0 g Intravenous Stopped 04/07/21 1654)  oseltamivir (TAMIFLU) capsule 30 mg (30 mg Oral Given 04/07/21 1620)  LORazepam (ATIVAN) injection 1 mg (1 mg Intravenous Given 04/07/21 1642)    ED Course  I have reviewed the triage vital signs and the nursing notes.  Pertinent labs & imaging results that were available during my care of the patient were reviewed by me and  considered in my medical decision making (see chart for details).    MDM Rules/Calculators/A&P  Patient presents for fever and altered mental status for the past 3 days.  On arrival, she is febrile to 101.2.  Vital signs are otherwise normal.  On exam, patient is disoriented.  She is able to follow commands but is unable to answer even simple questions.  Infectious work-up was initiated.  IV fluids and Tylenol was given.  On reassessment, patient does appear comfortable.  Vital signs remain normal.  Fever has defervesced.  Patient was found to have infection of influenza A as well as urinary infection.  Lactate was normal.  Additional IV fluids and ceftriaxone were given.  Tamiflu was given.  Patient to be admitted to hospitalist for further management.  Final Clinical Impression(s) / ED Diagnoses Final diagnoses:  Influenza A  Acute cystitis without hematuria    Rx / DC Orders ED Discharge Orders     None        Godfrey Pick, MD 04/08/21 [phone]

## 2021-04-07 NOTE — H&P (Signed)
History and Physical    Doris Gray BOF:[id number] DOB: 1930-05-28 DOA: 04/07/2021  PCP: Dorothyann Peng, NP  Patient coming from: Assisted living facility.  History obtained from patient's daughter.  Patient is confused.  Chief Complaint: Fever and confusion.  HPI: Doris Gray is a 88 y.o. female with history of hypertension, hypothyroidism was brought to the ER after the patient became increasingly confused.  Patient symptoms started about 3 days ago when patient's daughter when visiting her mom at the assisted living facility noted that patient was febrile.  Subsequent which patient's symptoms have worsened with increasing confusion and today was noticed to have confusion and slightly agitated.  Per patient's daughter patient did not have any nausea vomiting chest pain shortness of breath diarrhea or rash.  Did have some productive cough.  ED Course: In the ER patient had a temperature of 101.2 F confused CT head unremarkable UA is concerning for UTI and in patient's influenza A came back positive.  Chest x-ray was unremarkable.  Patient was given a dose of Ativan for confusion and agitation.  At the time of my exam patient is mildly lethargic and this is following receiving the Ativan.  Moves all extremities.  Lactic acid is normal calcium is 10.8.  Patient admitted for acute encephalopathy likely from developing sepsis from UTI and influenza.  Review of Systems: As per HPI, rest all negative.   Past Medical History:  Diagnosis Date   CAD (coronary artery disease)    Chicken pox    Colon polyps    Emphysema of lung ([place])    History of thyroid surgery    Hypertension    Hypothyroidism    Melanoma ([place])    Nodule of right lung    calcified granuloma 2.6 mm on CT from 2017    Splenic artery aneurysm ([name])    2.6 cm     Past Surgical History:  Procedure Laterality Date   OTHER SURGICAL HISTORY     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   THYROID SURGERY     TONSILLECTOMY AND  ADENOIDECTOMY       reports that she has quit smoking. She has never used smokeless tobacco. She reports that she does not currently use alcohol. She reports that she does not use drugs.  Allergies  Allergen Reactions   Amlodipine Other (See Comments)    Pt denies    Codeine    Statins     Myalgia    Sulfa Antibiotics    Sulfamethoxazole-Trimethoprim Diarrhea    AKA Bactrim   Sulfasalazine Other (See Comments) and Diarrhea   Tetanus Toxoid Swelling    Had arm swelling  Pt denies    Family History  Adopted: Yes    Prior to Admission medications   Medication Sig Start Date End Date Taking? Authorizing Provider  fexofenadine (ALLEGRA) 180 MG tablet Take 180 mg by mouth daily.   Yes [provider]  fluticasone (FLONASE) 50 MCG/ACT nasal spray Place 1 spray into both nostrils 2 (two) times daily.   Yes [provider]  guaiFENesin (MUCINEX) 600 MG 12 hr tablet Take 600 mg by mouth 2 (two) times daily.   Yes [provider]  levothyroxine (SYNTHROID) 137 MCG tablet Take 1 tablet (137 mcg total) by mouth daily. 08/13/20  Yes Nafziger, Tommi Rumps, NP  losartan (COZAAR) 50 MG tablet Take 1 tablet (50 mg total) by mouth daily. 05/23/20  Yes Nafziger, Tommi Rumps, NP    Physical Exam: Constitutional: Moderately built and nourished. Vitals:  04/07/21 1545 04/07/21 1653 04/07/21 1745 04/07/21 2000  BP: (!) 157/79 (!) 169/89 (!) 163/77 (!) 176/83  Pulse: 63 92 65 83  Resp: 20 (!) 21 (!) 22 (!) 24  Temp:   99 F (37.2 C) 98.7 F (37.1 C)  TempSrc:   Oral Oral  SpO2: 96% 93% 94% 97%  Weight:      Height:       Eyes: Anicteric no pallor. ENMT: No discharge from the ears eyes nose and mouth. Neck: No mass felt.  No neck rigidity. Respiratory: No rhonchi or crepitations. Cardiovascular: S1-S2 heard.   Abdomen: Soft nontender bowel sound present. Musculoskeletal: No edema. Skin: No rash. Neurologic: Mental lethargic but has just received Ativan about 2 hours ago.   Moving all extremities.  Pupils equal reacting to light. Psychiatric: Lethargic.   Labs on Admission: I have personally reviewed following labs and imaging studies  CBC: Recent Labs  Lab 04/07/21 1143  WBC 7.6  HGB 15.2*  HCT 46.7*  MCV 89.5  PLT 300   Basic Metabolic Panel: Recent Labs  Lab 04/07/21 1143  NA 135  K 4.3  CL 100  CO2 24  GLUCOSE 121*  BUN 15  CREATININE 0.93  CALCIUM 10.8*  MG 2.2   GFR: Estimated Creatinine Clearance: 45.5 mL/min (by C-G formula based on SCr of 0.93 mg/dL). Liver Function Tests: Recent Labs  Lab 04/07/21 1143  AST 59*  ALT 48*  ALKPHOS 70  BILITOT 0.5  PROT 7.6  ALBUMIN 4.8   No results for input(s): LIPASE, AMYLASE in the last 168 hours. No results for input(s): AMMONIA in the last 168 hours. Coagulation Profile: Recent Labs  Lab 04/07/21 1143  INR 1.1   Cardiac Enzymes: No results for input(s): CKTOTAL, CKMB, CKMBINDEX, TROPONINI in the last 168 hours. BNP (last 3 results) No results for input(s): PROBNP in the last 8760 hours. HbA1C: No results for input(s): HGBA1C in the last 72 hours. CBG: Recent Labs  Lab 04/07/21 1245  GLUCAP 98   Lipid Profile: No results for input(s): CHOL, HDL, LDLCALC, TRIG, CHOLHDL, LDLDIRECT in the last 72 hours. Thyroid Function Tests: No results for input(s): TSH, T4TOTAL, FREET4, T3FREE, THYROIDAB in the last 72 hours. Anemia Panel: No results for input(s): VITAMINB12, FOLATE, FERRITIN, TIBC, IRON, RETICCTPCT in the last 72 hours. Urine analysis:    Component Value Date/Time   COLORURINE YELLOW 04/07/2021 1520   APPEARANCEUR CLEAR 04/07/2021 1520   LABSPEC 1.014 04/07/2021 1520   PHURINE 5.5 04/07/2021 1520   GLUCOSEU NEGATIVE 04/07/2021 1520   HGBUR TRACE (A) 04/07/2021 1520   BILIRUBINUR NEGATIVE 04/07/2021 1520   BILIRUBINUR neg 04/10/2019 1400   KETONESUR NEGATIVE 04/07/2021 1520   PROTEINUR NEGATIVE 04/07/2021 1520   UROBILINOGEN 0.2 04/10/2019 1400   NITRITE  POSITIVE (A) 04/07/2021 1520   LEUKOCYTESUR LARGE (A) 04/07/2021 1520   Sepsis Labs: @LABRCNTIP (procalcitonin:4,lacticidven:4) ) Recent Results (from the past 240 hour(s))  Resp Panel by RT-PCR (Flu A&B, Covid) Nasopharyngeal Swab     Status: Abnormal   Collection Time: 04/07/21 11:53 AM   Specimen: Nasopharyngeal Swab; Nasopharyngeal(NP) swabs in vial transport medium  Result Value Ref Range Status   SARS Coronavirus 2 by RT PCR NEGATIVE NEGATIVE Final    Comment: (NOTE) SARS-CoV-2 target nucleic acids are NOT DETECTED.  The SARS-CoV-2 RNA is generally detectable in upper respiratory specimens during the acute phase of infection. The lowest concentration of SARS-CoV-2 viral copies this assay can detect is 138 copies/mL. A negative result does not  preclude SARS-Cov-2 infection and should not be used as the sole basis for treatment or other patient management decisions. A negative result may occur with  improper specimen collection/handling, submission of specimen other than nasopharyngeal swab, presence of viral mutation(s) within the areas targeted by this assay, and inadequate number of viral copies(<138 copies/mL). A negative result must be combined with clinical observations, patient history, and epidemiological information. The expected result is Negative.  Fact Sheet for Patients:  EntrepreneurPulse.com.au  Fact Sheet for Healthcare Providers:  IncredibleEmployment.be  This test is no t yet approved or cleared by the [place] FDA and  has been authorized for detection and/or diagnosis of SARS-CoV-2 by FDA under an Emergency Use Authorization (EUA). This EUA will remain  in effect (meaning this test can be used) for the duration of the COVID-19 declaration under Section 564(b)(1) of the Act, 21 U.S.C.section 360bbb-3(b)(1), unless the authorization is terminated  or revoked sooner.       Influenza A by PCR POSITIVE (A) NEGATIVE  Final   Influenza B by PCR NEGATIVE NEGATIVE Final    Comment: (NOTE) The Xpert Xpress SARS-CoV-2/FLU/RSV plus assay is intended as an aid in the diagnosis of influenza from Nasopharyngeal swab specimens and should not be used as a sole basis for treatment. Nasal washings and aspirates are unacceptable for Xpert Xpress SARS-CoV-2/FLU/RSV testing.  Fact Sheet for Patients: EntrepreneurPulse.com.au  Fact Sheet for Healthcare Providers: IncredibleEmployment.be  This test is not yet approved or cleared by the [place] FDA and has been authorized for detection and/or diagnosis of SARS-CoV-2 by FDA under an Emergency Use Authorization (EUA). This EUA will remain in effect (meaning this test can be used) for the duration of the COVID-19 declaration under Section 564(b)(1) of the Act, 21 U.S.C. section 360bbb-3(b)(1), unless the authorization is terminated or revoked.  Performed at [facility], [address], [place], [place] [address]      Radiological Exams on Admission: CT Head Wo Contrast  Result Date: 04/07/2021 CLINICAL DATA:  Mental status change of unknown etiology. EXAM: CT HEAD WITHOUT CONTRAST TECHNIQUE: Contiguous axial images were obtained from the base of the skull through the vertex without intravenous contrast. COMPARISON:  None. FINDINGS: Brain: No evidence of acute infarction, hemorrhage, hydrocephalus, extra-axial collection or mass lesion/mass effect. There is moderate to severe diffuse low-attenuation within the subcortical and periventricular white matter compatible with chronic microvascular disease. Prominence of sulci and ventricles compatible with brain atrophy. Vascular: No hyperdense vessel or unexpected calcification. Skull: Normal. Negative for fracture or focal lesion. Sinuses/Orbits: No acute finding. Other: None IMPRESSION: 1. No acute intracranial abnormalities. 2. Chronic small vessel ischemic  change and brain atrophy. Electronically Signed   By: Kerby Moors M.D.   On: 04/07/2021 13:23   DG Chest Port 1 View  Result Date: 04/07/2021 CLINICAL DATA:  Altered mental status and difficulty walking for 3 days. EXAM: PORTABLE CHEST 1 VIEW COMPARISON:  None. FINDINGS: The cardiac silhouette, mediastinal and hilar contours are within normal limits for age. The lungs are clear of an acute infiltrate. No pleural effusions or obvious pulmonary lesions. IMPRESSION: No acute cardiopulmonary findings. Electronically Signed   By: Marijo Sanes M.D.   On: 04/07/2021 12:46      Assessment/Plan Principal Problem:   Acute encephalopathy Active Problems:   Splenic artery aneurysm ([name])   Hypertension   Hypothyroidism   UTI (urinary tract infection)   Acute cystitis without hematuria   Influenza A    Acute encephalopathy likely from developing  sepsis likely source could be urinary tract infection and influenza A.  We will continue with ceftriaxone and I will add Zithromax for community-acquired pneumonia since patient is having productive cough.  Patient was able to take p.o. Tamiflu which we will continue.  Discussed with pharmacy to dose that.  Follow cultures continue hydration. Hypertension we will keep patient n.p.o. and IV hydralazine.  Once patient can reliably take p.o.'s will restart patient's ARB. Hypothyroidism on Synthroid. Hypercalcemia  -has been present since July of last year.  Will need further work-up as outpatient. Mildly elevated LFTs we will check acute hepatitis panel and also right upper quadrant abdominal sonogram.  Follow LFTs.  Abdomen appears benign on exam. History of splenic artery aneurysm did not complain of any abdominal pain.   DVT prophylaxis: Lovenox. Code Status: DNR. Family Communication: Patient's daughter. Disposition Plan: Back to facility when stable. Consults called: None. Admission status: Observation.   Rise Patience MD [facility] Pager [phone].  If 7PM-7AM, please contact night-coverage [email or web address] Password [facility]  04/07/2021, 10:53 PM

## 2021-04-07 NOTE — ED Notes (Signed)
Handoff report given to Sonne RN on 5e at [facility]

## 2021-04-07 NOTE — ED Notes (Signed)
Daughter called and informed of need for restraints.

## 2021-04-08 DIAGNOSIS — B9620 Unspecified Escherichia coli [E. coli] as the cause of diseases classified elsewhere: Secondary | ICD-10-CM | POA: Diagnosis present

## 2021-04-08 DIAGNOSIS — Z781 Physical restraint status: Secondary | ICD-10-CM | POA: Diagnosis not present

## 2021-04-08 DIAGNOSIS — I10 Essential (primary) hypertension: Secondary | ICD-10-CM

## 2021-04-08 DIAGNOSIS — K760 Fatty (change of) liver, not elsewhere classified: Secondary | ICD-10-CM | POA: Diagnosis present

## 2021-04-08 DIAGNOSIS — Z20822 Contact with and (suspected) exposure to covid-19: Secondary | ICD-10-CM | POA: Diagnosis present

## 2021-04-08 DIAGNOSIS — R7401 Elevation of levels of liver transaminase levels: Secondary | ICD-10-CM | POA: Diagnosis not present

## 2021-04-08 DIAGNOSIS — J439 Emphysema, unspecified: Secondary | ICD-10-CM | POA: Diagnosis present

## 2021-04-08 DIAGNOSIS — Z885 Allergy status to narcotic agent status: Secondary | ICD-10-CM | POA: Diagnosis not present

## 2021-04-08 DIAGNOSIS — Z66 Do not resuscitate: Secondary | ICD-10-CM | POA: Diagnosis present

## 2021-04-08 DIAGNOSIS — E530 Riboflavin deficiency: Secondary | ICD-10-CM | POA: Diagnosis present

## 2021-04-08 DIAGNOSIS — E039 Hypothyroidism, unspecified: Secondary | ICD-10-CM | POA: Diagnosis present

## 2021-04-08 DIAGNOSIS — I728 Aneurysm of other specified arteries: Secondary | ICD-10-CM | POA: Diagnosis present

## 2021-04-08 DIAGNOSIS — N390 Urinary tract infection, site not specified: Secondary | ICD-10-CM | POA: Diagnosis present

## 2021-04-08 DIAGNOSIS — R4182 Altered mental status, unspecified: Secondary | ICD-10-CM | POA: Diagnosis not present

## 2021-04-08 DIAGNOSIS — Z79899 Other long term (current) drug therapy: Secondary | ICD-10-CM | POA: Diagnosis not present

## 2021-04-08 DIAGNOSIS — Z87891 Personal history of nicotine dependence: Secondary | ICD-10-CM | POA: Diagnosis not present

## 2021-04-08 DIAGNOSIS — E669 Obesity, unspecified: Secondary | ICD-10-CM | POA: Diagnosis present

## 2021-04-08 DIAGNOSIS — Z6835 Body mass index (BMI) 35.0-35.9, adult: Secondary | ICD-10-CM | POA: Diagnosis not present

## 2021-04-08 DIAGNOSIS — F0390 Unspecified dementia without behavioral disturbance: Secondary | ICD-10-CM | POA: Diagnosis present

## 2021-04-08 DIAGNOSIS — Z85820 Personal history of malignant melanoma of skin: Secondary | ICD-10-CM | POA: Diagnosis not present

## 2021-04-08 DIAGNOSIS — I2510 Atherosclerotic heart disease of native coronary artery without angina pectoris: Secondary | ICD-10-CM | POA: Diagnosis present

## 2021-04-08 DIAGNOSIS — Z79890 Hormone replacement therapy (postmenopausal): Secondary | ICD-10-CM | POA: Diagnosis not present

## 2021-04-08 DIAGNOSIS — G9340 Encephalopathy, unspecified: Secondary | ICD-10-CM | POA: Diagnosis not present

## 2021-04-08 DIAGNOSIS — G9341 Metabolic encephalopathy: Secondary | ICD-10-CM | POA: Diagnosis present

## 2021-04-08 DIAGNOSIS — J1081 Influenza due to other identified influenza virus with encephalopathy: Secondary | ICD-10-CM | POA: Diagnosis present

## 2021-04-08 DIAGNOSIS — N3000 Acute cystitis without hematuria: Secondary | ICD-10-CM | POA: Diagnosis present

## 2021-04-08 DIAGNOSIS — Z1619 Resistance to other specified beta lactam antibiotics: Secondary | ICD-10-CM | POA: Diagnosis present

## 2021-04-08 LAB — CBC WITH DIFFERENTIAL/PLATELET
Abs Immature Granulocytes: 0.04 10*3/uL (ref 0.00–0.07)
Basophils Absolute: 0 10*3/uL (ref 0.0–0.1)
Basophils Relative: 0 %
Eosinophils Absolute: 0.2 10*3/uL (ref 0.0–0.5)
Eosinophils Relative: 3 %
HCT: 41.4 % (ref 36.0–46.0)
Hemoglobin: 13.5 g/dL (ref 12.0–15.0)
Immature Granulocytes: 1 %
Lymphocytes Relative: 20 %
Lymphs Abs: 1.2 10*3/uL (ref 0.7–4.0)
MCH: 29.4 pg (ref 26.0–34.0)
MCHC: 32.6 g/dL (ref 30.0–36.0)
MCV: 90.2 fL (ref 80.0–100.0)
Monocytes Absolute: 0.8 10*3/uL (ref 0.1–1.0)
Monocytes Relative: 14 %
Neutro Abs: 3.7 10*3/uL (ref 1.7–7.7)
Neutrophils Relative %: 62 %
Platelets: 151 10*3/uL (ref 150–400)
RBC: 4.59 MIL/uL (ref 3.87–5.11)
RDW: 13.8 % (ref 11.5–15.5)
WBC: 5.9 10*3/uL (ref 4.0–10.5)
nRBC: 0 % (ref 0.0–0.2)

## 2021-04-08 LAB — COMPREHENSIVE METABOLIC PANEL
ALT: 39 U/L (ref 0–44)
AST: 44 U/L — ABNORMAL HIGH (ref 15–41)
Albumin: 3.4 g/dL — ABNORMAL LOW (ref 3.5–5.0)
Alkaline Phosphatase: 53 U/L (ref 38–126)
Anion gap: 5 (ref 5–15)
BUN: 13 mg/dL (ref 8–23)
CO2: 26 mmol/L (ref 22–32)
Calcium: 9 mg/dL (ref 8.9–10.3)
Chloride: 107 mmol/L (ref 98–111)
Creatinine, Ser: 0.76 mg/dL (ref 0.44–1.00)
GFR, Estimated: >60 mL/min (ref >60)
Glucose, Bld: 99 mg/dL (ref 70–99)
Potassium: 3.8 mmol/L (ref 3.5–5.1)
Sodium: 138 mmol/L (ref 135–145)
Total Bilirubin: 0.4 mg/dL (ref 0.3–1.2)
Total Protein: 6.1 g/dL — ABNORMAL LOW (ref 6.5–8.1)

## 2021-04-08 LAB — MRSA NEXT GEN BY PCR, NASAL: MRSA by PCR Next Gen: NOT DETECTED

## 2021-04-08 LAB — HEPATITIS PANEL, ACUTE
HCV Ab: NONREACTIVE
Hep A IgM: NONREACTIVE
Hep B C IgM: NONREACTIVE
Hepatitis B Surface Ag: NONREACTIVE

## 2021-04-08 MED ORDER — LORAZEPAM 1 MG PO TABS
1.0000 mg | ORAL_TABLET | Freq: Three times a day (TID) | ORAL | Status: DC | PRN
  Administered 2021-04-08: 1 mg via ORAL
  Filled 2021-04-08: qty 1

## 2021-04-08 MED ORDER — HALOPERIDOL LACTATE 5 MG/ML IJ SOLN
INTRAMUSCULAR | Status: AC
  Filled 2021-04-08: qty 2

## 2021-04-08 MED ORDER — LORAZEPAM 2 MG/ML IJ SOLN: 0.5000 mg | INTRAMUSCULAR | Status: DC | PRN

## 2021-04-08 MED ORDER — QUETIAPINE FUMARATE 25 MG PO TABS
12.5000 mg | ORAL_TABLET | Freq: Every day | ORAL | Status: DC
  Administered 2021-04-08: 12.5 mg via ORAL
  Filled 2021-04-08: qty 1

## 2021-04-08 MED ORDER — HALOPERIDOL LACTATE 5 MG/ML IJ SOLN
2.0000 mg | Freq: Four times a day (QID) | INTRAMUSCULAR | Status: DC | PRN
  Administered 2021-04-08 – 2021-04-12 (×4): 2 mg via INTRAVENOUS
  Filled 2021-04-08 (×4): qty 1

## 2021-04-08 MED ORDER — OSELTAMIVIR PHOSPHATE 30 MG PO CAPS
30.0000 mg | ORAL_CAPSULE | Freq: Two times a day (BID) | ORAL | Status: AC
  Administered 2021-04-08 – 2021-04-11 (×8): 30 mg via ORAL
  Filled 2021-04-08 (×9): qty 1

## 2021-04-08 NOTE — Progress Notes (Signed)
PROGRESS NOTE    Doris Gray  CVE:[id number] DOB: 02-07-1931 DOA: 04/07/2021 PCP: Dorothyann Peng, NP    Chief Complaint  Patient presents with   Altered Mental Status    Brief Narrative:  Doris Gray is a 87 y.o. female with history of hypertension, hypothyroidism was brought to the ER  for fever and confusion. She was found to have UTI and influenza A.  CXR unremarkable.   Assessment & Plan:   Principal Problem:   Acute encephalopathy Active Problems:   Splenic artery aneurysm ([name])   Hypertension   Hypothyroidism   UTI (urinary tract infection)   Acute cystitis without hematuria   Influenza A   Acute metabolic encephalopathy secondary to UTI and influenza A: Slightly improved later today.  She is more calmer this afternoon after the dose of haldol.  She was started on IV ROCEPHIN.  Follow up urine cultures.  Continue with Tamiflu to complete the course.     Hypertension:  Well controlled.    Acute cystitis:  Cultures growing E coli, follow up sensitivities.     Hypothyroidism:l  Resume synthroid.    Elevated liver enzymes:  US abdomen showed  fatty liver.  Acute hepatitis panel is negative.    DVT prophylaxis: (Lovenox) Code Status: (DNR.) Family Communication: none at bedside.  Disposition:   Status is: Observation  The patient will require care spanning > 2 midnights and should be moved to inpatient because: IV rocephin.       Consultants:  None.   Procedures: none.   Antimicrobials: . Antibiotics Given (last 72 hours)     Date/Time Action Medication Dose Rate   04/07/21 1603 New Bag/Given   cefTRIAXone (ROCEPHIN) 2 g in sodium chloride 0.9 % 100 mL IVPB 2 g 200 mL/hr   04/07/21 1620 Given   oseltamivir (TAMIFLU) capsule 30 mg 30 mg    04/08/21 0225 New Bag/Given  [medication was not available]   azithromycin (ZITHROMAX) 500 mg in sodium chloride 0.9 % 250 mL IVPB 500 mg 250 mL/hr   04/08/21 1154 Given   oseltamivir  (TAMIFLU) capsule 30 mg 30 mg    04/08/21 1627 New Bag/Given   cefTRIAXone (ROCEPHIN) 2 g in sodium chloride 0.9 % 100 mL IVPB 2 g 200 mL/hr         Subjective: More calmer later this afternoon.   Objective: Vitals:   04/07/21 2000 04/08/21 0023 04/08/21 0348 04/08/21 0858  BP: (!) 176/83 (!) 167/72 (!) 148/77 137/71  Pulse: 83 78 74 62  Resp: (!) 24 (!) 24 20   Temp: 98.7 F (37.1 C) 99.3 F (37.4 C) (!) 97.4 F (36.3 C) 97.7 F (36.5 C)  TempSrc: Oral Axillary Oral   SpO2: 97% 93% 91% 91%  Weight:      Height:        Intake/Output Summary (Last 24 hours) at 04/08/2021 1137 Last data filed at 04/07/2021 1625 Gross per 24 hour  Intake --  Output 700 ml  Net -700 ml   Filed Weights   04/07/21 1138  Weight: 90.7 kg    Examination:  General exam: Appears calm and comfortable  Respiratory system: Clear to auscultation. Respiratory effort normal. Cardiovascular system: S1 & S2 heard, RRR. No JVD, murmurs, rubs, gallops or clicks. No pedal edema. Gastrointestinal system: Abdomen is nondistended, soft and nontender. No organomegaly or masses felt. Normal bowel sounds heard. Central nervous system: Alert and oriented. No focal neurological deficits. Extremities: Symmetric 5 x 5 power. Skin: No rashes, lesions  or ulcers Psychiatry: Judgement and insight appear normal. Mood & affect appropriate.     Data Reviewed: I have personally reviewed following labs and imaging studies  CBC: Recent Labs  Lab 04/07/21 1143 04/07/21 2307 04/08/21 0514  WBC 7.6 6.0 5.9  NEUTROABS  --   --  3.7  HGB 15.2* 13.8 13.5  HCT 46.7* 42.5 41.4  MCV 89.5 90.8 90.2  PLT 192 159 166    Basic Metabolic Panel: Recent Labs  Lab 04/07/21 1143 04/07/21 2307 04/08/21 0514  NA 135  --  138  K 4.3  --  3.8  CL 100  --  107  CO2 24  --  26  GLUCOSE 121*  --  99  BUN 15  --  13  CREATININE 0.93 0.88 0.76  CALCIUM 10.8*  --  9.0  MG 2.2  --   --     GFR: Estimated Creatinine  Clearance: 52.9 mL/min (by C-G formula based on SCr of 0.76 mg/dL).  Liver Function Tests: Recent Labs  Lab 04/07/21 1143 04/08/21 0514  AST 59* 44*  ALT 48* 39  ALKPHOS 70 53  BILITOT 0.5 0.4  PROT 7.6 6.1*  ALBUMIN 4.8 3.4*    CBG: Recent Labs  Lab 04/07/21 1245  GLUCAP 98     Recent Results (from the past 240 hour(s))  MRSA Next Gen by PCR, Nasal     Status: None   Collection Time: 04/07/21 12:10 AM   Specimen: Nasal Mucosa; Nasal Swab  Result Value Ref Range Status   MRSA by PCR Next Gen NOT DETECTED NOT DETECTED Final    Comment: (NOTE) The GeneXpert MRSA Assay (FDA approved for NASAL specimens only), is one component of a comprehensive MRSA colonization surveillance program. It is not intended to diagnose MRSA infection nor to guide or monitor treatment for MRSA infections. Test performance is not FDA approved in patients less than 72 years old. Performed at [facility], [place] [address] [place], [place] [address]   Resp Panel by RT-PCR (Flu A&B, Covid) Nasopharyngeal Swab     Status: Abnormal   Collection Time: 04/07/21 11:53 AM   Specimen: Nasopharyngeal Swab; Nasopharyngeal(NP) swabs in vial transport medium  Result Value Ref Range Status   SARS Coronavirus 2 by RT PCR NEGATIVE NEGATIVE Final    Comment: (NOTE) SARS-CoV-2 target nucleic acids are NOT DETECTED.  The SARS-CoV-2 RNA is generally detectable in upper respiratory specimens during the acute phase of infection. The lowest concentration of SARS-CoV-2 viral copies this assay can detect is 138 copies/mL. A negative result does not preclude SARS-Cov-2 infection and should not be used as the sole basis for treatment or other patient management decisions. A negative result may occur with  improper specimen collection/handling, submission of specimen other than nasopharyngeal swab, presence of viral mutation(s) within the areas targeted by this assay, and inadequate number of  viral copies(<138 copies/mL). A negative result must be combined with clinical observations, patient history, and epidemiological information. The expected result is Negative.  Fact Sheet for Patients:  EntrepreneurPulse.com.au  Fact Sheet for Healthcare Providers:  IncredibleEmployment.be  This test is no t yet approved or cleared by the [place] FDA and  has been authorized for detection and/or diagnosis of SARS-CoV-2 by FDA under an Emergency Use Authorization (EUA). This EUA will remain  in effect (meaning this test can be used) for the duration of the COVID-19 declaration under Section 564(b)(1) of the Act, 21 U.S.C.section 360bbb-3(b)(1), unless the authorization is  terminated  or revoked sooner.       Influenza A by PCR POSITIVE (A) NEGATIVE Final   Influenza B by PCR NEGATIVE NEGATIVE Final    Comment: (NOTE) The Xpert Xpress SARS-CoV-2/FLU/RSV plus assay is intended as an aid in the diagnosis of influenza from Nasopharyngeal swab specimens and should not be used as a sole basis for treatment. Nasal washings and aspirates are unacceptable for Xpert Xpress SARS-CoV-2/FLU/RSV testing.  Fact Sheet for Patients: EntrepreneurPulse.com.au  Fact Sheet for Healthcare Providers: IncredibleEmployment.be  This test is not yet approved or cleared by the [place] FDA and has been authorized for detection and/or diagnosis of SARS-CoV-2 by FDA under an Emergency Use Authorization (EUA). This EUA will remain in effect (meaning this test can be used) for the duration of the COVID-19 declaration under Section 564(b)(1) of the Act, 21 U.S.C. section 360bbb-3(b)(1), unless the authorization is terminated or revoked.  Performed at [facility], [address] [place], [place] [address]   Blood Culture (routine x 2)     Status: None (Preliminary result)   Collection Time: 04/07/21  12:18 PM   Specimen: BLOOD  Result Value Ref Range Status   Specimen Description   Final    BLOOD RIGHT ANTECUBITAL Performed at [facility], [address], [place], [place] [address]    Special Requests   Final    BOTTLES DRAWN AEROBIC AND ANAEROBIC Blood Culture adequate volume Performed at [facility], [address], [place], [place] [address]    Culture   Final    NO GROWTH < 24 HOURS Performed at [facility], [place] [address], [place], [place] [address]    Report Status PENDING  Incomplete  Blood Culture (routine x 2)     Status: None (Preliminary result)   Collection Time: 04/07/21 12:40 PM   Specimen: BLOOD LEFT ARM  Result Value Ref Range Status   Specimen Description   Final    BLOOD LEFT ARM Performed at [facility], [address], [place], [place] [address]    Special Requests   Final    Blood Culture adequate volume Performed at [facility], [address], [place], [place] [address]    Culture   Final    NO GROWTH < 12 HOURS Performed at [facility], [place] [address], [place], [place] [address]    Report Status PENDING  Incomplete         Radiology Studies: CT Head Wo Contrast  Result Date: 04/07/2021 CLINICAL DATA:  Mental status change of unknown etiology. EXAM: CT HEAD WITHOUT CONTRAST TECHNIQUE: Contiguous axial images were obtained from the base of the skull through the vertex without intravenous contrast. COMPARISON:  None. FINDINGS: Brain: No evidence of acute infarction, hemorrhage, hydrocephalus, extra-axial collection or mass lesion/mass effect. There is moderate to severe diffuse low-attenuation within the subcortical and periventricular white matter compatible with chronic microvascular disease. Prominence of sulci and ventricles compatible with brain atrophy. Vascular: No hyperdense vessel or unexpected calcification. Skull: Normal. Negative for  fracture or focal lesion. Sinuses/Orbits: No acute finding. Other: None IMPRESSION: 1. No acute intracranial abnormalities. 2. Chronic small vessel ischemic change and brain atrophy. Electronically Signed   By: Kerby Moors M.D.   On: 04/07/2021 13:23   DG Chest Port 1 View  Result Date: 04/07/2021 CLINICAL DATA:  Altered mental status and difficulty walking for 3 days. EXAM: PORTABLE CHEST 1 VIEW COMPARISON:  None. FINDINGS: The cardiac silhouette, mediastinal and  hilar contours are within normal limits for age. The lungs are clear of an acute infiltrate. No pleural effusions or obvious pulmonary lesions. IMPRESSION: No acute cardiopulmonary findings. Electronically Signed   By: Marijo Sanes M.D.   On: 04/07/2021 12:46   US Abdomen Limited RUQ (LIVER/GB)  Result Date: 04/08/2021 CLINICAL DATA:  Elevated liver enzymes. EXAM: ULTRASOUND ABDOMEN LIMITED RIGHT UPPER QUADRANT COMPARISON:  CT abdomen pelvis dated 06/21/2018. FINDINGS: Gallbladder: Cholecystectomy. Common bile duct: Diameter: 11 mm. Liver: There is diffuse increased liver echogenicity most commonly seen in the setting of fatty infiltration. Superimposed inflammation or fibrosis is not excluded. Clinical correlation is recommended. Portal vein is patent on color Doppler imaging with normal direction of blood flow towards the liver. Other: None. IMPRESSION: 1. Fatty liver. 2. Cholecystectomy. Electronically Signed   By: Anner Crete M.D.   On: 04/08/2021 00:27        Scheduled Meds:  enoxaparin (LOVENOX) injection  40 mg Subcutaneous Q24H   levothyroxine  68.5 mcg Intravenous Daily   oseltamivir  30 mg Oral BID   Continuous Infusions:  azithromycin 500 mg (04/08/21 0225)   cefTRIAXone (ROCEPHIN)  IV     lactated ringers 125 mL/hr at 04/07/21 2333     LOS: 0 days       Hosie Poisson, MD [facility]   To contact the attending provider between 7A-7P or the covering provider during after hours 7P-7A, please log  into the web site [email or web address] and access using universal [place] password for that web site. If you do not have the password, please call the hospital operator.  04/08/2021, 11:37 AM

## 2021-04-08 NOTE — Progress Notes (Signed)
PT Cancellation Note  Patient Details Name: Doris Gray MRN: [id number] DOB: 09-18-1930   Cancelled Treatment:    Reason Eval/Treat Not Completed: Fatigue/lethargy limiting ability to participate (pt is sedated with Haldol due to agitation. Will follow.)   Philomena Doheny PT 04/08/2021  Acute Rehabilitation Services Pager [phone] Office [phone]

## 2021-04-08 NOTE — Progress Notes (Signed)
Pt aggressive and combative. Pt pulled out PIV. MD notified. Patient daughter called via telephone.

## 2021-04-08 NOTE — Progress Notes (Signed)
Upon arriving in patient room, patient stated, "I'm ready to go" and " you don't do anything right". At this time, patient pulled off tele monitor, pulse ox, and PIV. Patient attempted to get out of bed. Mittens were attempted, but unsuccessful. Patient was combative (kicking, biting, and scratching) at this time. MD Karleen Hampshire notified by this RN. Patient daughter [name]) notified via telephone. Security was called.

## 2021-04-08 NOTE — Progress Notes (Signed)
The patient is injury-free, afebrile, alert, and oriented X 0. Pt was confused, agitated at times and needed constant redirection to remain in bed. Video monitoring for safety is active. Pt was too sleepy to take anything PO due to high risk of aspiration (Hospitalist on call was notified). No s/s of chest pain, SOB, nausea, vomiting, dizziness, signs or symptoms of bleeding or acute changes during this shift. We will continue to monitor and work toward achieving the care plan goals

## 2021-04-08 NOTE — Plan of Care (Signed)
  Problem: Safety: Goal: Non-violent Restraint(s) Outcome: Progressing   Problem: Education: Goal: Knowledge of General Education information will improve Description: Including pain rating scale, medication(s)/side effects and non-pharmacologic comfort measures Outcome: Progressing   Problem: Health Behavior/Discharge Planning: Goal: Ability to manage health-related needs will improve Outcome: Progressing   Problem: Clinical Measurements: Goal: Ability to maintain clinical measurements within normal limits will improve Outcome: Progressing Goal: Will remain free from infection Outcome: Progressing Goal: Diagnostic test results will improve Outcome: Progressing Goal: Respiratory complications will improve Outcome: Progressing Goal: Cardiovascular complication will be avoided Outcome: Progressing   Problem: Activity: Goal: Risk for activity intolerance will decrease Outcome: Not Progressing   Problem: Coping: Goal: Level of anxiety will decrease Outcome: Progressing   Problem: Elimination: Goal: Will not experience complications related to bowel motility Outcome: Progressing Goal: Will not experience complications related to urinary retention Outcome: Progressing   Problem: Pain Managment: Goal: General experience of comfort will improve Outcome: Progressing   Problem: Safety: Goal: Ability to remain free from injury will improve Outcome: Progressing   Problem: Skin Integrity: Goal: Risk for impaired skin integrity will decrease Outcome: Progressing

## 2021-04-09 ENCOUNTER — Encounter (HOSPITAL_COMMUNITY): Payer: Self-pay | Admitting: Internal Medicine

## 2021-04-09 ENCOUNTER — Inpatient Hospital Stay (HOSPITAL_COMMUNITY)
Admit: 2021-04-09 | Discharge: 2021-04-09 | Disposition: A | Discharge status: Home / Self Care | Payer: Medicare Other | Attending: Internal Medicine | Admitting: Internal Medicine

## 2021-04-09 LAB — BASIC METABOLIC PANEL
Anion gap: 7 (ref 5–15)
BUN: 12 mg/dL (ref 8–23)
CO2: 23 mmol/L (ref 22–32)
Calcium: 8.9 mg/dL (ref 8.9–10.3)
Chloride: 109 mmol/L (ref 98–111)
Creatinine, Ser: 0.63 mg/dL (ref 0.44–1.00)
GFR, Estimated: >60 mL/min (ref >60)
Glucose, Bld: 90 mg/dL (ref 70–99)
Potassium: 3.6 mmol/L (ref 3.5–5.1)
Sodium: 139 mmol/L (ref 135–145)

## 2021-04-09 LAB — URINE CULTURE: Culture: >=100000 — AB

## 2021-04-09 LAB — CBC
HCT: 41.2 % (ref 36.0–46.0)
Hemoglobin: 13.9 g/dL (ref 12.0–15.0)
MCH: 30.3 pg (ref 26.0–34.0)
MCHC: 33.7 g/dL (ref 30.0–36.0)
MCV: 89.8 fL (ref 80.0–100.0)
Platelets: 148 10*3/uL — ABNORMAL LOW (ref 150–400)
RBC: 4.59 MIL/uL (ref 3.87–5.11)
RDW: 13.8 % (ref 11.5–15.5)
WBC: 4.4 10*3/uL (ref 4.0–10.5)
nRBC: 0 % (ref 0.0–0.2)

## 2021-04-09 LAB — TSH: TSH: 0.137 u[IU]/mL — ABNORMAL LOW (ref 0.350–4.500)

## 2021-04-09 LAB — AMMONIA: Ammonia: 28 umol/L (ref 9–35)

## 2021-04-09 LAB — VITAMIN B12: Vitamin B-12: 165 pg/mL — ABNORMAL LOW (ref 180–914)

## 2021-04-09 MED ORDER — QUETIAPINE FUMARATE 25 MG PO TABS: 25.0000 mg | ORAL_TABLET | Freq: Every day | ORAL | Status: DC

## 2021-04-09 MED ORDER — GABAPENTIN 300 MG PO CAPS
300.0000 mg | ORAL_CAPSULE | Freq: Two times a day (BID) | ORAL | Status: DC
  Administered 2021-04-09 – 2021-04-14 (×10): 300 mg via ORAL
  Filled 2021-04-09 (×11): qty 1

## 2021-04-09 MED ORDER — LEVOTHYROXINE SODIUM 25 MCG PO TABS
137.0000 ug | ORAL_TABLET | Freq: Every day | ORAL | Status: DC
  Administered 2021-04-10 – 2021-04-12 (×3): 137 ug via ORAL
  Filled 2021-04-09 (×3): qty 1

## 2021-04-09 MED ORDER — CYANOCOBALAMIN 1000 MCG/ML IJ SOLN
1000.0000 ug | Freq: Once | INTRAMUSCULAR | Status: AC
  Administered 2021-04-10: 1000 ug via INTRAMUSCULAR
  Filled 2021-04-09: qty 1

## 2021-04-09 MED ORDER — TRAZODONE HCL 50 MG PO TABS
25.0000 mg | ORAL_TABLET | Freq: Every day | ORAL | Status: DC
  Administered 2021-04-09 – 2021-04-13 (×5): 25 mg via ORAL
  Filled 2021-04-09 (×5): qty 1

## 2021-04-09 MED ORDER — LORAZEPAM 2 MG/ML IJ SOLN
0.5000 mg | INTRAMUSCULAR | Status: DC | PRN
  Administered 2021-04-09 – 2021-04-12 (×3): 0.5 mg via INTRAVENOUS
  Filled 2021-04-09 (×3): qty 1

## 2021-04-09 MED ORDER — ADULT MULTIVITAMIN W/MINERALS CH
1.0000 | ORAL_TABLET | Freq: Every day | ORAL | Status: DC
  Administered 2021-04-09 – 2021-04-14 (×6): 1 via ORAL
  Filled 2021-04-09 (×6): qty 1

## 2021-04-09 MED ORDER — ENSURE ENLIVE PO LIQD
237.0000 mL | Freq: Two times a day (BID) | ORAL | Status: DC
Start: 2021-04-09 — End: 2021-04-14
  Administered 2021-04-09 – 2021-04-14 (×9): 237 mL via ORAL

## 2021-04-09 MED ORDER — ACETAMINOPHEN 500 MG PO TABS
1000.0000 mg | ORAL_TABLET | Freq: Three times a day (TID) | ORAL | Status: DC
  Administered 2021-04-09 – 2021-04-14 (×12): 1000 mg via ORAL
  Filled 2021-04-09 (×14): qty 2

## 2021-04-09 MED ORDER — FOSFOMYCIN TROMETHAMINE 3 G PO PACK
3.0000 g | PACK | Freq: Once | ORAL | Status: AC
Start: 2021-04-09 — End: 2021-04-09
  Administered 2021-04-09: 3 g via ORAL
  Filled 2021-04-09: qty 3

## 2021-04-09 MED ORDER — FOLIC ACID 5 MG/ML IJ SOLN
1.0000 mg | Freq: Every day | INTRAMUSCULAR | Status: DC
  Administered 2021-04-10: 1 mg via INTRAVENOUS
  Filled 2021-04-09 (×2): qty 0.2

## 2021-04-09 MED ORDER — AZITHROMYCIN 250 MG PO TABS
500.0000 mg | ORAL_TABLET | Freq: Once | ORAL | Status: AC
  Administered 2021-04-10: 500 mg via ORAL
  Filled 2021-04-09: qty 2

## 2021-04-09 MED ORDER — LORAZEPAM 1 MG PO TABS
1.0000 mg | ORAL_TABLET | Freq: Three times a day (TID) | ORAL | Status: DC | PRN
  Filled 2021-04-09: qty 1

## 2021-04-09 NOTE — Progress Notes (Signed)
EEG complete - results pending 

## 2021-04-09 NOTE — Progress Notes (Signed)
Physical Therapy Treatment Patient Details Name: Doris Gray MRN: [id number] DOB: 04/18/1931 Today's Date: 04/09/2021   History of Present Illness Doris Gray is a 87 y.o. female with history of hypertension, hypothyroidism was brought to the ER after the patient became increasingly confused. Pt with elevated temp and labs returned with Positive Flu A. Patient admitted for acute encephalopathy likely from developing sepsis from UTI and influenza.    PT Comments    The patient ambulated from BR with RW ,Cues for posture, patient not following, pushing RW ahead.  Daughter verbally instructing patient to return to  sitting on bed. Patient not following, sat then stood up again. Became more agitated and aggressive. Patient did return to supine,. RN requested to leave  soft restraints off for trial.. Bed  down and bed alarm on, daughter in room, /telesitter on.   Recommendations for follow up therapy are one component of a multi-disciplinary discharge planning process, led by the attending physician.  Recommendations may be updated based on patient status, additional functional criteria and insurance authorization.  Follow Up Recommendations  Skilled nursing-short term rehab (<3 hours/day)     Assistance Recommended at Discharge Frequent or constant Supervision/Assistance  Equipment Recommendations  None recommended by PT    Recommendations for Other Services       Precautions / Restrictions Precautions Precautions: Fall Precaution Comments: Combative at times. Do not give multiple instructions. Pt does well with daughter giving instructions. does not like to be touched or prodded     Mobility  Bed Mobility Overal bed mobility: Needs Assistance Bed Mobility: Sit to Supine     Supine to sit: Min guard Sit to supine: Supervision   General bed mobility comments: with much encouragement from daughter    Transfers Overall transfer level: Needs assistance Equipment used:  Rolling walker (2 wheels) Transfers: Sit to/from Stand Sit to Stand: Min guard;Min assist     Step pivot transfers: Min guard     General transfer comment: from toilet cues for safety    Ambulation/Gait Ambulation/Gait assistance: Min assist Gait Distance (Feet): 15 Feet Assistive device: Rolling walker (2 wheels) Gait Pattern/deviations: Step-to pattern;Step-through pattern;Trunk flexed       General Gait Details: RW tending to far out in front, cues  for position but paient  did not follow   Stairs             Wheelchair Mobility    Modified Rankin (Stroke Patients Only)       Balance Overall balance assessment: Needs assistance Sitting-balance support: No upper extremity supported;Feet supported Sitting balance-Leahy Scale: Fair     Standing balance support: During functional activity;Single extremity supported Standing balance-Leahy Scale: Fair Standing balance comment: stnading to provide self pericare after toileting.                            Cognition Arousal/Alertness: Awake/alert Behavior During Therapy: Agitated;Impulsive Overall Cognitive Status: Impaired/Different from baseline Area of Impairment: Following commands                       Following Commands: Follows one step commands inconsistently       General Comments: became more agitated and aggressive upon returng from bathroom. daughter providing multimodal cues to get back into bed. patient  shook her fist at therapist once, appeared  paranoid when therapist attemptiong to afely guard patient's balance. stated" Don't touch me."        Exercises  General Comments General comments (skin integrity, edema, etc.): No known falls. Fair balance with pt unpredictable with mobility.      Pertinent Vitals/Pain Pain Assessment: No/denies pain Breathing: normal    Home Living Family/patient expects to be discharged to:: Skilled nursing facility                    Additional Comments: Pt normally resides at [facility]. Daughter reports    Prior Function            PT Goals (current goals can now be found in the care plan section) Acute Rehab PT Goals Patient Stated Goal: per daughter, return to [place] PT Goal Formulation: With family Time For Goal Achievement: 04/23/21 Potential to Achieve Goals: Fair Progress towards PT goals: Progressing toward goals    Frequency    Min 2X/week      PT Plan Current plan remains appropriate    Co-evaluation PT/OT/SLP Co-Evaluation/Treatment: Yes Reason for Co-Treatment: For patient/therapist safety PT goals addressed during session: Mobility/safety with mobility OT goals addressed during session: ADL's and self-care      AM-PAC PT "6 Clicks" Mobility   Outcome Measure  Help needed turning from your back to your side while in a flat bed without using bedrails?: A Little Help needed moving from lying on your back to sitting on the side of a flat bed without using bedrails?: None Help needed moving to and from a bed to a chair (including a wheelchair)?: A Little Help needed standing up from a chair using your arms (e.g., wheelchair or bedside chair)?: A Little Help needed to walk in hospital room?: A Lot Help needed climbing 3-5 steps with a railing? : Total 6 Click Score: 16    End of Session Equipment Utilized During Treatment: Gait belt Activity Tolerance: Patient tolerated treatment well Patient left: in bed;with call bell/phone within reach;with bed alarm set;with family/visitor present Nurse Communication: Mobility status PT Visit Diagnosis: Unsteadiness on feet (R26.81);Difficulty in walking, not elsewhere classified (R26.2)     Time: 0938-1000 PT Time Calculation (min) (ACUTE ONLY): 22 min  Charges:  $Gait Training: 8-22 mins                     [place] Pager [phone] Office [phone]    Claretha Cooper 04/09/2021, 2:29 PM

## 2021-04-09 NOTE — Progress Notes (Signed)
PHARMACIST - PHYSICIAN COMMUNICATION  DR:   Karleen Hampshire  CONCERNING: IV to Oral Route Change Policy  RECOMMENDATION: This patient is receiving levothyroxine by the intravenous route.  Based on criteria approved by the Pharmacy and Therapeutics Committee, the intravenous medication(s) is/are being converted to the equivalent oral dose form(s).   DESCRIPTION: These criteria include: The patient is eating (either orally or via tube) and/or has been taking other orally administered medications for a least 24 hours The patient has no evidence of active gastrointestinal bleeding or impaired GI absorption (gastrectomy, short bowel, patient on TNA or NPO).  If you have questions about this conversion, please contact the Pharmacy Department  []   [phone] )  [facility] []   [phone] )  [facility] []   [phone] )  [facility] []   [phone] )  [facility] [x]   [phone] )  [place], PharmD 04/09/2021 10:49 AM

## 2021-04-09 NOTE — Progress Notes (Signed)
PROGRESS NOTE    Doris Gray  ZOX:[id number] DOB: 10-11-1930 DOA: 04/07/2021 PCP: Dorothyann Peng, NP    Chief Complaint  Patient presents with   Altered Mental Status    Brief Narrative:  Doris Gray is a 87 y.o. female with history of hypertension, hypothyroidism was brought to the ER  for fever and confusion. She was found to have UTI and influenza A.  CXR unremarkable.   Assessment & Plan:   Principal Problem:   Acute encephalopathy Active Problems:   Splenic artery aneurysm ([name])   Hypertension   Hypothyroidism   UTI (urinary tract infection)   Acute cystitis without hematuria   Influenza A   Acute metabolic encephalopathy secondary to UTI and influenza A: Slightly improved later today, pt is more calmer with family around. She started hitting the staff and got m roe agitated.  Continue with haldol as needed.  Increase the dose of seroquel to 25 mg daily.   E coli UTI; One dose of fosfomycin given.    Influenza A infection:  - complete the course of tamiflu.     Hypertension:  BP parameters are optimal.    Acute cystitis:  Cultures growing e coli resistant to rocephin and ampicillin.  ID recommends one dose of fosfomycin.     Hypothyroidism:l  Resume synthroid.    Elevated liver enzymes:  Liver enzymes are improving.  US abdomen showed  fatty liver.  Acute hepatitis panel is negative.    Therapy eval recommending SNF.    DVT prophylaxis: (Lovenox) Code Status: (DNR.) Family Communication: none at bedside.  Disposition:   Status is: Observation  The patient will require care spanning > 2 midnights and should be moved to inpatient because: IV rocephin.       Consultants:  Palliative care   Procedures: none.   Antimicrobials: . Antibiotics Given (last 72 hours)     Date/Time Action Medication Dose Rate   04/07/21 1603 New Bag/Given   cefTRIAXone (ROCEPHIN) 2 g in sodium chloride 0.9 % 100 mL IVPB 2 g 200 mL/hr    04/07/21 1620 Given   oseltamivir (TAMIFLU) capsule 30 mg 30 mg    04/08/21 0225 New Bag/Given  [medication was not available]   azithromycin (ZITHROMAX) 500 mg in sodium chloride 0.9 % 250 mL IVPB 500 mg 250 mL/hr   04/08/21 1154 Given   oseltamivir (TAMIFLU) capsule 30 mg 30 mg    04/08/21 1627 New Bag/Given   cefTRIAXone (ROCEPHIN) 2 g in sodium chloride 0.9 % 100 mL IVPB 2 g 200 mL/hr   04/08/21 2139 Given   oseltamivir (TAMIFLU) capsule 30 mg 30 mg    04/09/21 0107 New Bag/Given   azithromycin (ZITHROMAX) 500 mg in sodium chloride 0.9 % 250 mL IVPB 500 mg 250 mL/hr   04/09/21 1006 Given   oseltamivir (TAMIFLU) capsule 30 mg 30 mg    04/09/21 1423 Given   fosfomycin (MONUROL) packet 3 g 3 g          Subjective: No chest pain or sob.   Objective: Vitals:   04/08/21 0348 04/08/21 0858 04/08/21 2018 04/09/21 1019  BP: (!) 148/77 137/71 139/68   Pulse: 74 62 68 86  Resp: 20  18   Temp: (!) 97.4 F (36.3 C) 97.7 F (36.5 C) 97.7 F (36.5 C)   TempSrc: Oral  Oral   SpO2: 91% 91% 97% 100%  Weight:      Height:       No intake or output data in  the 24 hours ending 04/09/21 1554  Filed Weights   04/07/21 1138  Weight: 90.7 kg    Examination:  General exam: Appears calm and comfortable  Respiratory system: Clear to auscultation. Respiratory effort normal. Cardiovascular system: S1 & S2 heard, RRR. No JVD, . No pedal edema. Gastrointestinal system: Abdomen is nondistended, soft and nontender. Normal bowel sounds heard. Central nervous system: Alert and oriented to person only.  Extremities: Symmetric 5 x 5 power. Skin: No rashes, lesions or ulcers Psychiatry:  Mood & affect appropriate.      Data Reviewed: I have personally reviewed following labs and imaging studies  CBC: Recent Labs  Lab 04/07/21 1143 04/07/21 2307 04/08/21 0514 04/09/21 0451  WBC 7.6 6.0 5.9 4.4  NEUTROABS  --   --  3.7  --   HGB 15.2* 13.8 13.5 13.9  HCT 46.7* 42.5 41.4 41.2   MCV 89.5 90.8 90.2 89.8  PLT 192 159 151 148*     Basic Metabolic Panel: Recent Labs  Lab 04/07/21 1143 04/07/21 2307 04/08/21 0514 04/09/21 0451  NA 135  --  138 139  K 4.3  --  3.8 3.6  CL 100  --  107 109  CO2 24  --  26 23  GLUCOSE 121*  --  99 90  BUN 15  --  13 12  CREATININE 0.93 0.88 0.76 0.63  CALCIUM 10.8*  --  9.0 8.9  MG 2.2  --   --   --      GFR: Estimated Creatinine Clearance: 52.9 mL/min (by C-G formula based on SCr of 0.63 mg/dL).  Liver Function Tests: Recent Labs  Lab 04/07/21 1143 04/08/21 0514  AST 59* 44*  ALT 48* 39  ALKPHOS 70 53  BILITOT 0.5 0.4  PROT 7.6 6.1*  ALBUMIN 4.8 3.4*     CBG: Recent Labs  Lab 04/07/21 1245  GLUCAP 98      Recent Results (from the past 240 hour(s))  MRSA Next Gen by PCR, Nasal     Status: None   Collection Time: 04/07/21 12:10 AM   Specimen: Nasal Mucosa; Nasal Swab  Result Value Ref Range Status   MRSA by PCR Next Gen NOT DETECTED NOT DETECTED Final    Comment: (NOTE) The GeneXpert MRSA Assay (FDA approved for NASAL specimens only), is one component of a comprehensive MRSA colonization surveillance program. It is not intended to diagnose MRSA infection nor to guide or monitor treatment for MRSA infections. Test performance is not FDA approved in patients less than 28 years old. Performed at [facility], [place] [address], [place], [place] [address]   Resp Panel by RT-PCR (Flu A&B, Covid) Nasopharyngeal Swab     Status: Abnormal   Collection Time: 04/07/21 11:53 AM   Specimen: Nasopharyngeal Swab; Nasopharyngeal(NP) swabs in vial transport medium  Result Value Ref Range Status   SARS Coronavirus 2 by RT PCR NEGATIVE NEGATIVE Final    Comment: (NOTE) SARS-CoV-2 target nucleic acids are NOT DETECTED.  The SARS-CoV-2 RNA is generally detectable in upper respiratory specimens during the acute phase of infection. The lowest concentration of SARS-CoV-2 viral copies this assay  can detect is 138 copies/mL. A negative result does not preclude SARS-Cov-2 infection and should not be used as the sole basis for treatment or other patient management decisions. A negative result may occur with  improper specimen collection/handling, submission of specimen other than nasopharyngeal swab, presence of viral mutation(s) within the areas targeted by this assay, and inadequate  number of viral copies(<138 copies/mL). A negative result must be combined with clinical observations, patient history, and epidemiological information. The expected result is Negative.  Fact Sheet for Patients:  EntrepreneurPulse.com.au  Fact Sheet for Healthcare Providers:  IncredibleEmployment.be  This test is no t yet approved or cleared by the [place] FDA and  has been authorized for detection and/or diagnosis of SARS-CoV-2 by FDA under an Emergency Use Authorization (EUA). This EUA will remain  in effect (meaning this test can be used) for the duration of the COVID-19 declaration under Section 564(b)(1) of the Act, 21 U.S.C.section 360bbb-3(b)(1), unless the authorization is terminated  or revoked sooner.       Influenza A by PCR POSITIVE (A) NEGATIVE Final   Influenza B by PCR NEGATIVE NEGATIVE Final    Comment: (NOTE) The Xpert Xpress SARS-CoV-2/FLU/RSV plus assay is intended as an aid in the diagnosis of influenza from Nasopharyngeal swab specimens and should not be used as a sole basis for treatment. Nasal washings and aspirates are unacceptable for Xpert Xpress SARS-CoV-2/FLU/RSV testing.  Fact Sheet for Patients: EntrepreneurPulse.com.au  Fact Sheet for Healthcare Providers: IncredibleEmployment.be  This test is not yet approved or cleared by the [place] FDA and has been authorized for detection and/or diagnosis of SARS-CoV-2 by FDA under an Emergency Use Authorization (EUA). This EUA will  remain in effect (meaning this test can be used) for the duration of the COVID-19 declaration under Section 564(b)(1) of the Act, 21 U.S.C. section 360bbb-3(b)(1), unless the authorization is terminated or revoked.  Performed at [facility], [address] [place], [place] [address]   Blood Culture (routine x 2)     Status: None (Preliminary result)   Collection Time: 04/07/21 12:18 PM   Specimen: BLOOD  Result Value Ref Range Status   Specimen Description   Final    BLOOD RIGHT ANTECUBITAL Performed at [facility], [address] [place], [place] [address]    Special Requests   Final    BOTTLES DRAWN AEROBIC AND ANAEROBIC Blood Culture adequate volume Performed at [facility], [address], [place], [place] [address]    Culture   Final    NO GROWTH 2 DAYS Performed at [facility], [place] [address], [place], [place] [address]    Report Status PENDING  Incomplete  Blood Culture (routine x 2)     Status: None (Preliminary result)   Collection Time: 04/07/21 12:40 PM   Specimen: BLOOD LEFT ARM  Result Value Ref Range Status   Specimen Description   Final    BLOOD LEFT ARM Performed at [facility], [address], [place], [place] [address]    Special Requests   Final    Blood Culture adequate volume Performed at [facility], [address], [place], [place] [address]    Culture   Final    NO GROWTH 2 DAYS Performed at [facility], [place] [address], [place], [place] [address]    Report Status PENDING  Incomplete  Urine Culture     Status: Abnormal   Collection Time: 04/07/21  3:20 PM   Specimen: In/Out Cath Urine  Result Value Ref Range Status   Specimen Description   Final    IN/OUT CATH URINE Performed at [facility], [address] [place], [place] [address]    Special Requests   Final    NONE Performed at [facility], [address], [place], [place] [address]  Culture >=100,000 COLONIES/mL ESCHERICHIA COLI (A)  Final   Report Status 04/09/2021 FINAL  Final   Organism ID, Bacteria ESCHERICHIA COLI (A)  Final      Susceptibility   Escherichia coli - MIC*    AMPICILLIN >=32 RESISTANT Resistant     CEFAZOLIN >=64 RESISTANT Resistant     CEFEPIME <=0.12 SENSITIVE Sensitive     CEFTRIAXONE 32 RESISTANT Resistant     CIPROFLOXACIN <=0.25 SENSITIVE Sensitive     GENTAMICIN >=16 RESISTANT Resistant     IMIPENEM <=0.25 SENSITIVE Sensitive     NITROFURANTOIN <=16 SENSITIVE Sensitive     TRIMETH/SULFA <=20 SENSITIVE Sensitive     AMPICILLIN/SULBACTAM 16 INTERMEDIATE Intermediate     PIP/TAZO <=4 SENSITIVE Sensitive     * >=100,000 COLONIES/mL ESCHERICHIA COLI          Radiology Studies: US Abdomen Limited RUQ (LIVER/GB)  Result Date: 04/08/2021 CLINICAL DATA:  Elevated liver enzymes. EXAM: ULTRASOUND ABDOMEN LIMITED RIGHT UPPER QUADRANT COMPARISON:  CT abdomen pelvis dated 06/21/2018. FINDINGS: Gallbladder: Cholecystectomy. Common bile duct: Diameter: 11 mm. Liver: There is diffuse increased liver echogenicity most commonly seen in the setting of fatty infiltration. Superimposed inflammation or fibrosis is not excluded. Clinical correlation is recommended. Portal vein is patent on color Doppler imaging with normal direction of blood flow towards the liver. Other: None. IMPRESSION: 1. Fatty liver. 2. Cholecystectomy. Electronically Signed   By: Anner Crete M.D.   On: 04/08/2021 00:27        Scheduled Meds:  [START ON 04/10/2021] azithromycin  500 mg Oral Once   enoxaparin (LOVENOX) injection  40 mg Subcutaneous Q24H   feeding supplement  237 mL Oral BID BM   [START ON 04/10/2021] levothyroxine  137 mcg Oral Q0600   multivitamin with minerals  1 tablet Oral Daily   oseltamivir  30 mg Oral BID   QUEtiapine  12.5 mg Oral QHS   Continuous Infusions:     LOS: 1 day        Hosie Poisson, MD [facility]   To contact the attending provider between 7A-7P or the covering provider during after hours 7P-7A, please log into the web site [email or web address] and access using universal [place] password for that web site. If you do not have the password, please call the hospital operator.  04/09/2021, 3:54 PM

## 2021-04-09 NOTE — Evaluation (Addendum)
Physical Therapy Evaluation Patient Details Name: Doris Gray MRN: [id number] DOB: 10-20-1930 Today's Date: 04/09/2021  History of Present Illness  Doris Gray is a 87 y.o. female with history of hypertension, hypothyroidism was brought to the ER after the patient became increasingly confused. Pt with elevated temp and labs returned with Positive Flu A. Patient admitted for acute encephalopathy likely from developing sepsis from UTI and influenza.  Clinical Impression  The patient's daughter present to provide frequent cues for patient to participate. Restraints removed from arms and legs, patient  not agitated initially and able to perform mobility with multimodal cues, mostly from daughter. Patient assisted with rolling  to each to, able to move to sitting up on bed edge. Patient ambulated to BR x 15'. Remained in BR with OT. PT will return to assist back to bed.  Patient was independent with ambulation at ALF PTA per daughter. Pt admitted with above diagnosis. Pt currently with functional limitations due to the deficits listed below (see PT Problem List). Pt will benefit from skilled PT to increase their independence and safety with mobility to allow discharge to the venue listed below.        Recommendations for follow up therapy are one component of a multi-disciplinary discharge planning process, led by the attending physician.  Recommendations may be updated based on patient status, additional functional criteria and insurance authorization.  Follow Up Recommendations Skilled nursing-short term rehab (<3 hours/day)    Assistance Recommended at Discharge Frequent or constant Supervision/Assistance  Functional Status Assessment Patient has had a recent decline in their functional status and demonstrates the ability to make significant improvements in function in a reasonable and predictable amount of time.  Equipment Recommendations  None recommended by PT    Recommendations for  Other Services       Precautions / Restrictions Precautions Precautions: Fall Precaution Comments: Combative at times. Do not give multiple instructions. Pt does well with daughter giving instructions. does not like to be touched or prodded Restrictions Weight Bearing Restrictions: No      Mobility  Bed Mobility Overal bed mobility: Needs Assistance Bed Mobility: ;Supine to Sit     Supine to sit: Min guard    General bed mobility comments: daughter providing max  cues for patient to perform  task  to  roll without too much tactile stimultion, max cues to sit up, diifficulty processing.    Transfers Overall transfer level: Needs assistance Equipment used: Rolling walker (2 wheels) Transfers: Sit to/from Stand Sit to Stand: Min guard;Min assist   Step pivot transfers: Min guard       General transfer comment: multimodal cues to stand at RW from bed .    Ambulation/Gait Ambulation/Gait assistance: Min assist Gait Distance (Feet): 15 Feet (x 1) Assistive device: Rolling walker (2 wheels) Gait Pattern/deviations: Step-to pattern;Step-through pattern;Trunk flexed       General Gait Details: RW tending to far out in front, cues  for position but paient  did not follow  Stairs            Wheelchair Mobility    Modified Rankin (Stroke Patients Only)       Balance Overall balance assessment: Needs assistance Sitting-balance support: No upper extremity supported;Feet supported Sitting balance-Leahy Scale: Fair     Standing balance support: During functional activity;Single extremity supported Standing balance-Leahy Scale: Fair Standing balance comment: standing to provide self pericare after toileting.  Pertinent Vitals/Pain Pain Assessment: No/denies pain Pain Score: 0-No pain Breathing: normal Negative Vocalization: none Facial Expression: smiling or inexpressive Body Language: relaxed Consolability: no need to  console PAINAD Score: 0    Home Living Family/patient expects to be discharged to:: Skilled nursing facility                   Additional Comments: Pt normally resides at [facility]. Daughter reports    Prior Function Prior Level of Function : Independent/Modified Independent             Mobility Comments: Per daughter, pt typcally walks herself to the dining room without an assistive device. ADLs Comments: Per daughter, pt was performing her own dressing, bathing and toileting unassisted.     Hand Dominance   Dominant Hand: Left    Extremity/Trunk Assessment   Upper Extremity Assessment Upper Extremity Assessment: Defer to OT evaluation    Lower Extremity Assessment Lower Extremity Assessment: Generalized weakness    Cervical / Trunk Assessment Cervical / Trunk Assessment: Normal  Communication   Communication: No difficulties  Cognition Arousal/Alertness: Awake/alert Behavior During Therapy: Agitated;Impulsive Overall Cognitive Status: Impaired/Different from baseline Area of Impairment: Following commands                       Following Commands: Follows one step commands inconsistently       General Comments: Pt becomes increasingly agitated with instructions. Pt required significantly increased time to return to bed due to confusion/combativeness. Daughter worked with pt very patiently to help pt follow instructions with VC only. Per daughter, does not display this combativeness at baseline.        General Comments General comments (skin integrity, edema, etc.): No known falls. Fair balance with pt unpredictable with mobility.    Exercises     Assessment/Plan    PT Assessment Patient needs continued PT services  PT Problem List Decreased strength;Decreased mobility;Decreased knowledge of precautions;Decreased activity tolerance;Decreased cognition;Decreased balance;Decreased knowledge of use of DME       PT Treatment  Interventions DME instruction;Therapeutic activities;Cognitive remediation;Gait training;Therapeutic exercise;Patient/family education;Functional mobility training;Balance training    PT Goals (Current goals can be found in the Care Plan section)  Acute Rehab PT Goals Patient Stated Goal: per daughter, return to [place] PT Goal Formulation: With family Time For Goal Achievement: 04/23/21 Potential to Achieve Goals: Fair    Frequency Min 2X/week   Barriers to discharge        Co-evaluation PT/OT/SLP Co-Evaluation/Treatment: Yes Reason for Co-Treatment: For patient/therapist safety PT goals addressed during session: Mobility/safety with mobility OT goals addressed during session: ADL's and self-care       AM-PAC PT "6 Clicks" Mobility  Outcome Measure Help needed turning from your back to your side while in a flat bed without using bedrails?: A Little Help needed moving from lying on your back to sitting on the side of a flat bed without using bedrails?: A Little Help needed moving to and from a bed to a chair (including a wheelchair)?: A Little Help needed standing up from a chair using your arms (e.g., wheelchair or bedside chair)?: A Little Help needed to walk in hospital room?: A Lot Help needed climbing 3-5 steps with a railing? : Total 6 Click Score: 15    End of Session Equipment Utilized During Treatment: Gait belt Activity Tolerance: Patient tolerated treatment well Patient left: in BR with OT Nurse Communication: Mobility status PT Visit Diagnosis: Unsteadiness on feet (R26.81);Difficulty in  walking, not elsewhere classified (R26.2)    Time: 7106-2694 PT Time Calculation (min) (ACUTE ONLY): 35 min   Charges:   PT Evaluation $PT Eval Moderate Complexity: [place] Pager [phone] Office [phone]   Claretha Cooper 04/09/2021, 2:18 PM

## 2021-04-09 NOTE — Progress Notes (Signed)
Attempted to try pt without restraints after working with physical therapy, pt became aggressive with phlebotomist and attempted to punch at this RN. Soft wrist restraints reapplied and MD made aware. Will continue to monitor and retry without restraints at a later time.

## 2021-04-09 NOTE — Progress Notes (Signed)
Initial Nutrition Assessment  DOCUMENTATION CODES:   Obesity unspecified  INTERVENTION:   -Recommend repletion of Vitamin B-12  -Ensure Enlive po BID, each supplement provides 350 kcal and 20 grams of protein  -Multivitamin with minerals daily  NUTRITION DIAGNOSIS:   Increased nutrient needs related to acute illness as evidenced by estimated needs.  GOAL:   Patient will meet greater than or equal to 90% of their needs  MONITOR:   PO intake, Supplement acceptance, Labs, Weight trends, I & O's  REASON FOR ASSESSMENT:   Consult Assessment of nutrition requirement/status  ASSESSMENT:   87 y.o. female with history of hypertension, hypothyroidism was brought to the ER  for fever and confusion. She was found to have UTI and influenza A.  Pt currently in restraints given aggression and combativeness. Alert/oriented x 1. Patient on diet but no PO documented yet.  Will order Ensure supplements given poor PO d/t confusion.  Per weight records, pt's weight has been increasing since 2019.   Medications reviewed.  Labs reviewed: Vitamin B-12 (165)    NUTRITION - FOCUSED PHYSICAL EXAM:  Deferred given restraints  Diet Order:   Diet Order             Diet regular Room service appropriate? Yes; Fluid consistency: Thin  Diet effective now                   EDUCATION NEEDS:   No education needs have been identified at this time  Skin:  Skin Assessment: Reviewed RN Assessment  Last BM:  PTA  Height:   Ht Readings from Last 1 Encounters:  04/07/21 5\' 3"  (1.6 m)    Weight:   Wt Readings from Last 1 Encounters:  04/07/21 90.7 kg    BMI:  Body mass index is 35.43 kg/m.  Estimated Nutritional Needs:   Kcal:  1600-1800  Protein:  65-75g  Fluid:  1.8L/day  Clayton Bibles, MS, RD, LDN Inpatient Clinical Dietitian Contact information available via Amion

## 2021-04-09 NOTE — Evaluation (Signed)
Occupational Therapy Evaluation Patient Details Name: Doris Gray MRN: [id number] DOB: [birth year] Today's Date: 04/09/2021   History of Present Illness Doris Gray is a 87 y.o. female with history of hypertension, hypothyroidism was brought to the ER after the patient became increasingly confused. Pt with elevated temp and labs returned with Positive Flu A. Patient admitted for acute encephalopathy likely from developing sepsis from UTI and influenza.   Clinical Impression   Patient is currently requiring assistance with ADLs including min guard assist with toileting however with questionable thoroughness on peri hygiene, minimal assist with LE dressing, and mind guard to min assist with bathing, and setup/supervision assist with all other ADLs.  Current level of function is below patient's typical baseline.  During this evaluation, patient was limited by generalized weakness, impaired activity tolerance, and primarily, increased confusion and continued agitated and combativeness, all of which has the potential to impact patient's and caregiver's safety and independence during functional mobility, as well as performance for ADLs.  Patient lives at an assisted living facility and is typically able to ambulate to the dining room without AD or help and performs ADLs independently.  Patient demonstrates fair rehab potential, and should benefit from continued skilled occupational therapy services while in acute care to maximize safety, independence and quality of life at home.  Continued occupational therapy services in a SNF setting prior to return home is recommended.  ?    Recommendations for follow up therapy are one component of a multi-disciplinary discharge planning process, led by the attending physician.  Recommendations may be updated based on patient status, additional functional criteria and insurance authorization.   Follow Up Recommendations  Skilled nursing-short term rehab (<3  hours/day)    Assistance Recommended at Discharge Frequent or constant Supervision/Assistance  Functional Status Assessment  Patient has had a recent decline in their functional status and demonstrates the ability to make significant improvements in function in a reasonable and predictable amount of time.  Equipment Recommendations   (per facility)    Recommendations for Other Services       Precautions / Restrictions Precautions Precautions: Fall Precaution Comments: Combative at times. Do not give multiple instructions. Pt does well with daughter giving instructions. Restrictions Weight Bearing Restrictions: No      Mobility Bed Mobility Overal bed mobility: Needs Assistance Bed Mobility: Sit to Supine       Sit to supine: Supervision   General bed mobility comments: Max cuing and increased time for pt to accept instructions from daughter to sit on EOB and return to supine. Increased effort for pt to raise LEs onto bed but able without external assist.    Transfers Overall transfer level: Needs assistance   Transfers: Sit to/from Stand;Bed to chair/wheelchair/BSC Sit to Stand: Min guard     Step pivot transfers: Min guard            Balance Overall balance assessment: Needs assistance   Sitting balance-Leahy Scale: Fair       Standing balance-Leahy Scale: Fair                             ADL either performed or assessed with clinical judgement   ADL Overall ADL's : Needs assistance/impaired (Increased assistance may be required based on pt's compliance and mood.) Eating/Feeding: Supervision/ safety;Set up;Bed level;Cueing for sequencing   Grooming: Sitting;Wash/dry hands;Set up;Supervision/safety;Cueing for sequencing   Upper Body Bathing: Min guard;Cueing for sequencing;Sitting   Lower Body Bathing: Minimal  assistance;Sitting/lateral leans;Cueing for sequencing   Upper Body Dressing : Min guard;Sitting   Lower Body Dressing: Minimal  assistance;Sitting/lateral leans;Sit to/from stand Lower Body Dressing Details (indicate cue type and reason): Based on general assessment. Toilet Transfer: Rolling walker (2 wheels);Ambulation;Min Actor Details (indicate cue type and reason): Pt refused to use wall mounted grab bar. Able to rise and lower to commode with Min guard assist to aim for seat, Toileting- Clothing Manipulation and Hygiene: Supervision/safety;Set up;Cueing for sequencing Toileting - Clothing Manipulation Details (indicate cue type and reason): Increased time for hygiene with possible perseveration. Pt did not allow assistance and peri care may be incomplete posteriorly.     Functional mobility during ADLs: Min guard;Supervision/safety;Rolling walker (2 wheels) General ADL Comments: Pt able to verbalize "I've never used on of these before" regarding RW     Vision   Vision Assessment?: No apparent visual deficits     Perception Perception Perception: Within Functional Limits   Praxis Praxis Praxis: Impaired Praxis Impairment Details: Perseveration;Ideation    Pertinent Vitals/Pain Pain Assessment: Faces Pain Score: 0-No pain Breathing: normal Negative Vocalization: none Facial Expression: smiling or inexpressive Body Language: relaxed Consolability: no need to console PAINAD Score: 0     Hand Dominance Left   Extremity/Trunk Assessment Upper Extremity Assessment Upper Extremity Assessment: Difficult to assess due to impaired cognition (Did not formally assess due to pt agitated. UE appear [facility].)   Lower Extremity Assessment Lower Extremity Assessment: Defer to PT evaluation       Communication Communication Communication: No difficulties   Cognition Arousal/Alertness: Awake/alert Behavior During Therapy: Agitated;Impulsive Overall Cognitive Status: History of cognitive impairments - at baseline Area of Impairment: Following commands                        Following Commands: Follows one step commands inconsistently       General Comments: Pt becomes increasingly agitated with instructions. Pt required significantly increased time to return to bed due to confusion/combativeness. Daughter worked with pt very patiently to help pt return to bed [employer] cues only. Per daughter, t does not display this combativeness at baseline.     General Comments  No known falls. Fair balance with pt unpredictable with mobility.    Exercises     Shoulder Instructions      Home Living Family/patient expects to be discharged to:: Skilled nursing facility                                 Additional Comments: Pt normally resides at [facility]. Daughter reports      Prior Functioning/Environment Prior Level of Function : Independent/Modified Independent             Mobility Comments: Per daughter, pt typcally walks herself to the dining room without an assistive device. ADLs Comments: Per daughter, pt was performing her own dressing, bathing and toileting unassisted.        OT Problem List: Decreased cognition;Decreased safety awareness;Decreased knowledge of use of DME or AE;Impaired balance (sitting and/or standing);Decreased knowledge of precautions      OT Treatment/Interventions: Self-care/ADL training;Therapeutic exercise;Therapeutic activities;Cognitive remediation/compensation;DME and/or AE instruction;Patient/family education;Balance training    OT Goals(Current goals can be found in the care plan section) Acute Rehab OT Goals Patient Stated Goal: Daughter asking that pt is given 1-step instructions for mobility and not to place hands on pt unless absolutely necessary as  this sets pt off. OT Goal Formulation: With family Time For Goal Achievement: 04/23/21 Potential to Achieve Goals: Fair ADL Goals Pt Will Perform Grooming: standing;with supervision Pt Will Perform Lower Body Dressing: with set-up;with  supervision Pt Will Transfer to Toilet: ambulating;with supervision Pt Will Perform Toileting - Clothing Manipulation and hygiene: with supervision;sit to/from stand;sitting/lateral leans Additional ADL Goal #1: Pt will demonstrate improved cognition by consistently following 1-step instructions related to ADLs and basic mobility and will be combate/agitation-free in order to be able to return to ALF.Marland Kitchen  OT Frequency: Min 2X/week   Barriers to D/C:            Co-evaluation PT/OT/SLP Co-Evaluation/Treatment: Yes Reason for Co-Treatment: Necessary to address cognition/behavior during functional activity;For patient/therapist safety;To address functional/ADL transfers PT goals addressed during session: Mobility/safety with mobility OT goals addressed during session: ADL's and self-care      AM-PAC OT "6 Clicks" Daily Activity     Outcome Measure Help from another person eating meals?: A Little Help from another person taking care of personal grooming?: A Little Help from another person toileting, which includes using toliet, bedpan, or urinal?: A Little Help from another person bathing (including washing, rinsing, drying)?: A Little Help from another person to put on and taking off regular upper body clothing?: A Little Help from another person to put on and taking off regular lower body clothing?: A Little 6 Click Score: 18   End of Session Equipment Utilized During Treatment: Gait belt;Rolling walker (2 wheels) Nurse Communication: Mobility status  Activity Tolerance: Treatment limited secondary to agitation;Patient tolerated treatment well Patient left: in bed;with call bell/phone within reach;with bed alarm set;with family/visitor present  OT Visit Diagnosis: Other symptoms and signs involving cognitive function;Other abnormalities of gait and mobility (R26.89)                Time: 8937-3428 OT Time Calculation (min): 31 min Charges:  OT General Charges $OT Visit: 1 Visit OT  Evaluation $OT Eval Moderate Complexity: [place], OT Acute Rehab Services Office: [phone] 04/09/2021 Julien Girt 04/09/2021, 10:40 AM

## 2021-04-09 NOTE — Consult Note (Signed)
Palliative Care  Consultation Note  Doris Gray was admitted with UTI and Influenza and has had persistent agitated delirium and behavioral disturbance associated with this acute illness and baseline dementia.Palliative care was consulted for symptom management and goals of care.  Recommendations:  I will plan on meeting with her daughter tomorrow morning to discuss goals of care and obtain additional information. Her agitated delirium is very different than her baseline status-likely triggered by UTI and acute viral illness. She has very low Vitamin B12-this could be contributing to her declining neurocognitive status- will need to replete this aggressively and use IM administration for best absorption and most rapid repletion. Will also need to replete Folic Acid most likely.  Start Tylenol 1000mg  scheduled TID- pain can translate as agitation in patients with dementia. She has not responded to haldol or other antipsychotics, therefore I recommend using Gabapentin -start at 300 BID and continue to titrate up to highest tolerated dose. I also added trazodone low dose QHS for sleep-she may respond better to a TCA than other sedatives but will watch for any worsening of her symptoms after starting this tonight. She is probably also experiencing sleep deprivation which can make her overall agitation much worse. Discharging back to familiar setting and routine and complete treatment of acute infection and issues generally will improve this type of decompensated delirium, but in some cases this can continue to be a challenge and may never achieve full resolution or recovery.  Will follow up in AM.  Lane Hacker, DO Palliative Medicine  Time: 30 min Greater than 50%  of this time was spent counseling and coordinating care related to the above assessment and plan.

## 2021-04-09 NOTE — Plan of Care (Signed)
  Problem: Safety: Goal: Non-violent Restraint(s) Outcome: Not Progressing   Problem: Education: Goal: Knowledge of General Education information will improve Description: Including pain rating scale, medication(s)/side effects and non-pharmacologic comfort measures Outcome: Not Progressing   Problem: Health Behavior/Discharge Planning: Goal: Ability to manage health-related needs will improve Outcome: Not Progressing   Problem: Clinical Measurements: Goal: Ability to maintain clinical measurements within normal limits will improve Outcome: Progressing   Problem: Clinical Measurements: Goal: Ability to maintain clinical measurements within normal limits will improve Outcome: Progressing Goal: Will remain free from infection Outcome: Progressing Goal: Diagnostic test results will improve Outcome: Progressing Goal: Respiratory complications will improve Outcome: Progressing Goal: Cardiovascular complication will be avoided Outcome: Progressing   Problem: Activity: Goal: Risk for activity intolerance will decrease Outcome: Not Progressing   Problem: Nutrition: Goal: Adequate nutrition will be maintained Outcome: Progressing   Problem: Elimination: Goal: Will not experience complications related to bowel motility Outcome: Progressing Goal: Will not experience complications related to urinary retention Outcome: Progressing   Problem: Safety: Goal: Ability to remain free from injury will improve Outcome: Progressing   Problem: Skin Integrity: Goal: Risk for impaired skin integrity will decrease Outcome: Progressing

## 2021-04-09 NOTE — NC FL2 (Signed)
[place] LEVEL OF CARE SCREENING TOOL     IDENTIFICATION  Patient Name: Doris Gray Birthdate: [birth year] Sex: female Admission Date (Current Location): 04/07/2021  [facility] and [place] Number:  Herbalist and Address:  [facility],  [place], [place]      Provider Number: [id number]  Attending Physician Name and Address:  Hosie Poisson, MD  Relative Name and Phone Number:  Gaetana Michaelis (Daughter)   [phone]    Current Level of Care: Hospital Recommended Level of Care: [place] Prior Approval Number:    Date Approved/Denied:   PASRR Number: [id number]  Discharge Plan: SNF    Current Diagnoses: Patient Active Problem List   Diagnosis Date Noted   UTI (urinary tract infection) 04/07/2021   Acute encephalopathy 04/07/2021   Acute cystitis without hematuria 04/07/2021   Influenza A 04/07/2021   Splenic artery aneurysm ([name])    Hypertension    Hypothyroidism    Melanoma ([place])     Orientation RESPIRATION BLADDER Height & Weight     Self  Normal External catheter Weight: 90.7 kg Height:  5\' 3"  (160 cm)  BEHAVIORAL SYMPTOMS/MOOD NEUROLOGICAL BOWEL NUTRITION STATUS      Continent Diet (see d/c summary)  AMBULATORY STATUS COMMUNICATION OF NEEDS Skin   Extensive Assist Verbally Normal                       Personal Care Assistance Level of Assistance  Bathing, Feeding, Dressing Bathing Assistance: Maximum assistance Feeding assistance: Limited assistance Dressing Assistance: Limited assistance     Functional Limitations Info  Sight, Hearing, Speech Sight Info: Adequate Hearing Info: Adequate Speech Info: Adequate    SPECIAL CARE FACTORS FREQUENCY  PT (By licensed PT), OT (By licensed OT)     PT Frequency: 5X/W OT Frequency: 5X/W            Contractures Contractures Info: Not present    Additional Factors Info  Code Status, Allergies Code Status Info:  DNR Allergies Info: Amlodipine   Codeine   Statins   Sulfa Antibiotics   Sulfamethoxazole-trimethoprim   Sulfasalazine   Tetanus Toxoid           Current Medications (04/09/2021):  This is the current hospital active medication list Current Facility-Administered Medications  Medication Dose Route Frequency Provider Last Rate Last Admin   acetaminophen (TYLENOL) tablet 650 mg  650 mg Oral Q6H PRN Rise Patience, MD       Or   acetaminophen (TYLENOL) suppository 650 mg  650 mg Rectal Q6H PRN Rise Patience, MD       [START ON 04/10/2021] azithromycin (ZITHROMAX) tablet 500 mg  500 mg Oral Once Dimple Nanas, RPH       enoxaparin (LOVENOX) injection 40 mg  40 mg Subcutaneous Q24H Rise Patience, MD   40 mg at 04/08/21 1620   feeding supplement (ENSURE ENLIVE / ENSURE PLUS) liquid 237 mL  237 mL Oral BID BM Hosie Poisson, MD   237 mL at 04/09/21 1423   haloperidol lactate (HALDOL) injection 2 mg  2 mg Intravenous Q6H PRN Hosie Poisson, MD   2 mg at 04/09/21 1116   hydrALAZINE (APRESOLINE) injection 10 mg  10 mg Intravenous Q4H PRN Rise Patience, MD       [START ON 04/10/2021] levothyroxine (SYNTHROID) tablet 137 mcg  137 mcg Oral Q0600 Dimple Nanas, RPH       LORazepam (ATIVAN) injection 0.5 mg  0.5 mg Intravenous Q4H PRN Hosie Poisson, MD       LORazepam (ATIVAN) tablet 1 mg  1 mg Oral Q8H PRN Hosie Poisson, MD   1 mg at 04/08/21 2139   multivitamin with minerals tablet 1 tablet  1 tablet Oral Daily Hosie Poisson, MD   1 tablet at 04/09/21 1423   oseltamivir (TAMIFLU) capsule 30 mg  30 mg Oral BID Kathryne Eriksson, NP   30 mg at 04/09/21 1006   QUEtiapine (SEROQUEL) tablet 12.5 mg  12.5 mg Oral QHS Hosie Poisson, MD   12.5 mg at 04/08/21 2139     Discharge Medications: Please see discharge summary for a list of discharge medications.  Relevant Imaging Results:  Relevant Lab Results:   Additional Information [address]  [place],  [place]

## 2021-04-10 DIAGNOSIS — R4182 Altered mental status, unspecified: Secondary | ICD-10-CM

## 2021-04-10 MED ORDER — FOLIC ACID 1 MG PO TABS
1.0000 mg | ORAL_TABLET | Freq: Every day | ORAL | Status: DC
  Administered 2021-04-11 – 2021-04-14 (×4): 1 mg via ORAL
  Filled 2021-04-10 (×4): qty 1

## 2021-04-10 MED ORDER — IPRATROPIUM-ALBUTEROL 0.5-2.5 (3) MG/3ML IN SOLN: 3.0000 mL | RESPIRATORY_TRACT | Status: DC | PRN

## 2021-04-10 MED ORDER — METHYLPREDNISOLONE SODIUM SUCC 125 MG IJ SOLR
60.0000 mg | INTRAMUSCULAR | Status: DC
  Administered 2021-04-10 – 2021-04-11 (×2): 60 mg via INTRAVENOUS
  Filled 2021-04-10 (×2): qty 2

## 2021-04-10 NOTE — Procedures (Signed)
Patient Name: Doris Gray  MRN: [id number]  Epilepsy Attending: Lora Havens  Referring Physician/Provider: Dr Hosie Poisson Date: 04/09/2021 Duration: 24.55 mins  Patient history: 89y F with ams. EEG to evaluate for seizure  Level of alertness: Awake  AEDs during EEG study: GBP  Technical aspects: This EEG study was done with scalp electrodes positioned according to the 10-20 International system of electrode placement. Electrical activity was acquired at a sampling rate of 500Hz  and reviewed with a high frequency filter of 70Hz  and a low frequency filter of 1Hz . EEG data were recorded continuously and digitally stored.   Description: The posterior dominant rhythm consists of 8-9 Hz activity of moderate voltage (25-35 uV) seen predominantly in posterior head regions, symmetric and reactive to eye opening and eye closing. EEG showed intermittent generalized 5 to 6 Hz theta-delta slowing. Hyperventilation and photic stimulation were not performed.     ABNORMALITY - Intermittent slow, generalized  IMPRESSION: This study is suggestive of mild diffuse encephalopathy, nonspecific etiology. No seizures or epileptiform discharges were seen throughout the recording.  Doris Gray

## 2021-04-10 NOTE — Plan of Care (Signed)
  Problem: Nutrition: Goal: Adequate nutrition will be maintained Outcome: Progressing   Problem: Safety: Goal: Ability to remain free from injury will improve Outcome: Progressing   Problem: Skin Integrity: Goal: Risk for impaired skin integrity will decrease Outcome: Progressing   

## 2021-04-10 NOTE — Progress Notes (Signed)
PROGRESS NOTE    Doris Gray  YBW:[id number] DOB: August 28, 1930 DOA: 04/07/2021 PCP: Dorothyann Peng, NP    Chief Complaint  Patient presents with   Altered Mental Status    Brief Narrative:  Doris Gray is a 87 y.o. female with history of hypertension, hypothyroidism was brought to the ER  for fever and confusion. She was found to have UTI and influenza A.  CXR unremarkable.   Assessment & Plan:   Principal Problem:   Acute encephalopathy Active Problems:   Splenic artery aneurysm ([name])   Hypertension   Hypothyroidism   UTI (urinary tract infection)   Acute cystitis without hematuria   Influenza A   Acute metabolic encephalopathy secondary to UTI and influenza A: As per RN, pt received all her night meds early this am. Slightly drowsy, but awake enough to answer simpel questions.  Improving. Off 4 point restraints this am.  - avoid narcotics and benzodiazepines.     E coli UTI; One dose of fosfomycin given.    Influenza A infection:  - complete the course of tamiflu.  - wheezing with rhonchi on exam.  - wills tart her on IV steroids and duonebs.  Continue to monitor.  - cough medication as needed.    Hypertension:  BP parameters are  well controlled.    Acute cystitis:  Cultures growing e coli resistant to rocephin and ampicillin.  ID recommends one dose of fosfomycin.     Hypothyroidism: Resume synthroid.    Elevated liver enzymes:  Liver enzymes are improving.  US abdomen showed  fatty liver.  Acute hepatitis panel is negative.  Recommend outpatient follow up with a liver panel in 4 weeks.    Therapy eval recommending SNF.  In view of advanced age, dementia, pallaitive care consulted for goals of care.    DVT prophylaxis: (Lovenox) Code Status: (DNR.) Family Communication: none at bedside.  Disposition:   Status is: Observation  The patient will require care spanning > 2 midnights and should be moved to inpatient because: pt  wheezing and needs IV steroids.       Consultants:  Palliative care   Procedures: none.   Antimicrobials: . Antibiotics Given (last 72 hours)     Date/Time Action Medication Dose Rate   04/07/21 1603 New Bag/Given   cefTRIAXone (ROCEPHIN) 2 g in sodium chloride 0.9 % 100 mL IVPB 2 g 200 mL/hr   04/07/21 1620 Given   oseltamivir (TAMIFLU) capsule 30 mg 30 mg    04/08/21 0225 New Bag/Given  [medication was not available]   azithromycin (ZITHROMAX) 500 mg in sodium chloride 0.9 % 250 mL IVPB 500 mg 250 mL/hr   04/08/21 1154 Given   oseltamivir (TAMIFLU) capsule 30 mg 30 mg    04/08/21 1627 New Bag/Given   cefTRIAXone (ROCEPHIN) 2 g in sodium chloride 0.9 % 100 mL IVPB 2 g 200 mL/hr   04/08/21 2139 Given   oseltamivir (TAMIFLU) capsule 30 mg 30 mg    04/09/21 0107 New Bag/Given   azithromycin (ZITHROMAX) 500 mg in sodium chloride 0.9 % 250 mL IVPB 500 mg 250 mL/hr   04/09/21 1006 Given   oseltamivir (TAMIFLU) capsule 30 mg 30 mg    04/09/21 1423 Given   fosfomycin (MONUROL) packet 3 g 3 g    04/09/21 2159 Given   oseltamivir (TAMIFLU) capsule 30 mg 30 mg    04/10/21 7681 Given   azithromycin (ZITHROMAX) tablet 500 mg 500 mg    04/10/21 1004 Given   oseltamivir (  TAMIFLU) capsule 30 mg 30 mg          Subjective: Denies any chest pain, no nausea, vomiting or abd apin.   Objective: Vitals:   04/09/21 1019 04/09/21 2053 04/10/21 0541 04/10/21 1241  BP:  (!) 151/101 (!) 159/78 139/89  Pulse: 86 75 63 68  Resp:  20 20 19   Temp:  98.2 F (36.8 C) 98.1 F (36.7 C) 97.7 F (36.5 C)  TempSrc:   Oral Oral  SpO2: 100% 95% 94% 92%  Weight:      Height:        Intake/Output Summary (Last 24 hours) at 04/10/2021 1501 Last data filed at 04/10/2021 1241 Gross per 24 hour  Intake 590 ml  Output 600 ml  Net -10 ml    Filed Weights   04/07/21 1138  Weight: 90.7 kg    Examination:  General exam: elderly woman ill appearing , not in distress.  Respiratory system:  bilateral  wheezing  and rhonchi more posteriorly> anteriorly., on RA.  Cardiovascular system: S1 & S2 heard, RRR. No JVD,  No pedal edema. Gastrointestinal system: Abdomen is nondistended, soft and nontender.  Normal bowel sounds heard. Central nervous system: sleepy, but awake enough to answer simple questions.  Extremities: no pedal edema.  Skin: No rashes seen. Psychiatry: Mood is appropriate.       Data Reviewed: I have personally reviewed following labs and imaging studies  CBC: Recent Labs  Lab 04/07/21 1143 04/07/21 2307 04/08/21 0514 04/09/21 0451  WBC 7.6 6.0 5.9 4.4  NEUTROABS  --   --  3.7  --   HGB 15.2* 13.8 13.5 13.9  HCT 46.7* 42.5 41.4 41.2  MCV 89.5 90.8 90.2 89.8  PLT 192 159 151 148*     Basic Metabolic Panel: Recent Labs  Lab 04/07/21 1143 04/07/21 2307 04/08/21 0514 04/09/21 0451  NA 135  --  138 139  K 4.3  --  3.8 3.6  CL 100  --  107 109  CO2 24  --  26 23  GLUCOSE 121*  --  99 90  BUN 15  --  13 12  CREATININE 0.93 0.88 0.76 0.63  CALCIUM 10.8*  --  9.0 8.9  MG 2.2  --   --   --      GFR: Estimated Creatinine Clearance: 52.9 mL/min (by C-G formula based on SCr of 0.63 mg/dL).  Liver Function Tests: Recent Labs  Lab 04/07/21 1143 04/08/21 0514  AST 59* 44*  ALT 48* 39  ALKPHOS 70 53  BILITOT 0.5 0.4  PROT 7.6 6.1*  ALBUMIN 4.8 3.4*     CBG: Recent Labs  Lab 04/07/21 1245  GLUCAP 98      Recent Results (from the past 240 hour(s))  MRSA Next Gen by PCR, Nasal     Status: None   Collection Time: 04/07/21 12:10 AM   Specimen: Nasal Mucosa; Nasal Swab  Result Value Ref Range Status   MRSA by PCR Next Gen NOT DETECTED NOT DETECTED Final    Comment: (NOTE) The GeneXpert MRSA Assay (FDA approved for NASAL specimens only), is one component of a comprehensive MRSA colonization surveillance program. It is not intended to diagnose MRSA infection nor to guide or monitor treatment for MRSA infections. Test performance is  not FDA approved in patients less than 22 years old. Performed at [facility], [place] [address] [place], [place] [address]   Resp Panel by RT-PCR (Flu A&B, Covid) Nasopharyngeal Swab  Status: Abnormal   Collection Time: 04/07/21 11:53 AM   Specimen: Nasopharyngeal Swab; Nasopharyngeal(NP) swabs in vial transport medium  Result Value Ref Range Status   SARS Coronavirus 2 by RT PCR NEGATIVE NEGATIVE Final    Comment: (NOTE) SARS-CoV-2 target nucleic acids are NOT DETECTED.  The SARS-CoV-2 RNA is generally detectable in upper respiratory specimens during the acute phase of infection. The lowest concentration of SARS-CoV-2 viral copies this assay can detect is 138 copies/mL. A negative result does not preclude SARS-Cov-2 infection and should not be used as the sole basis for treatment or other patient management decisions. A negative result may occur with  improper specimen collection/handling, submission of specimen other than nasopharyngeal swab, presence of viral mutation(s) within the areas targeted by this assay, and inadequate number of viral copies(<138 copies/mL). A negative result must be combined with clinical observations, patient history, and epidemiological information. The expected result is Negative.  Fact Sheet for Patients:  EntrepreneurPulse.com.au  Fact Sheet for Healthcare Providers:  IncredibleEmployment.be  This test is no t yet approved or cleared by the [place] FDA and  has been authorized for detection and/or diagnosis of SARS-CoV-2 by FDA under an Emergency Use Authorization (EUA). This EUA will remain  in effect (meaning this test can be used) for the duration of the COVID-19 declaration under Section 564(b)(1) of the Act, 21 U.S.C.section 360bbb-3(b)(1), unless the authorization is terminated  or revoked sooner.       Influenza A by PCR POSITIVE (A) NEGATIVE Final   Influenza B by PCR  NEGATIVE NEGATIVE Final    Comment: (NOTE) The Xpert Xpress SARS-CoV-2/FLU/RSV plus assay is intended as an aid in the diagnosis of influenza from Nasopharyngeal swab specimens and should not be used as a sole basis for treatment. Nasal washings and aspirates are unacceptable for Xpert Xpress SARS-CoV-2/FLU/RSV testing.  Fact Sheet for Patients: EntrepreneurPulse.com.au  Fact Sheet for Healthcare Providers: IncredibleEmployment.be  This test is not yet approved or cleared by the [place] FDA and has been authorized for detection and/or diagnosis of SARS-CoV-2 by FDA under an Emergency Use Authorization (EUA). This EUA will remain in effect (meaning this test can be used) for the duration of the COVID-19 declaration under Section 564(b)(1) of the Act, 21 U.S.C. section 360bbb-3(b)(1), unless the authorization is terminated or revoked.  Performed at [facility], [address] [place], [place] [address]   Blood Culture (routine x 2)     Status: None (Preliminary result)   Collection Time: 04/07/21 12:18 PM   Specimen: BLOOD  Result Value Ref Range Status   Specimen Description   Final    BLOOD RIGHT ANTECUBITAL Performed at [facility], [address], [name], [place] [address]    Special Requests   Final    BOTTLES DRAWN AEROBIC AND ANAEROBIC Blood Culture adequate volume Performed at [facility], [address] [place], [place] [address]    Culture   Final    NO GROWTH 3 DAYS Performed at [facility], [place] [address], [place], [place] [address]    Report Status PENDING  Incomplete  Blood Culture (routine x 2)     Status: None (Preliminary result)   Collection Time: 04/07/21 12:40 PM   Specimen: BLOOD LEFT ARM  Result Value Ref Range Status   Specimen Description   Final    BLOOD LEFT ARM Performed at [facility], [address]  [place],  [address]    Special Requests  Final    Blood Culture adequate volume Performed at [facility], [address] [place], [place] [address]    Culture   Final    NO GROWTH 3 DAYS Performed at [facility], [place] [address] [place], [place] [address]    Report Status PENDING  Incomplete  Urine Culture     Status: Abnormal   Collection Time: 04/07/21  3:20 PM   Specimen: In/Out Cath Urine  Result Value Ref Range Status   Specimen Description   Final    IN/OUT CATH URINE Performed at [facility], [address] [place], [place] [address]    Special Requests   Final    NONE Performed at [facility], [address] [place], [place] [address]    Culture >=100,000 COLONIES/mL ESCHERICHIA COLI (A)  Final   Report Status 04/09/2021 FINAL  Final   Organism ID, Bacteria ESCHERICHIA COLI (A)  Final      Susceptibility   Escherichia coli - MIC*    AMPICILLIN >=32 RESISTANT Resistant     CEFAZOLIN >=64 RESISTANT Resistant     CEFEPIME <=0.12 SENSITIVE Sensitive     CEFTRIAXONE 32 RESISTANT Resistant     CIPROFLOXACIN <=0.25 SENSITIVE Sensitive     GENTAMICIN >=16 RESISTANT Resistant     IMIPENEM <=0.25 SENSITIVE Sensitive     NITROFURANTOIN <=16 SENSITIVE Sensitive     TRIMETH/SULFA <=20 SENSITIVE Sensitive     AMPICILLIN/SULBACTAM 16 INTERMEDIATE Intermediate     PIP/TAZO <=4 SENSITIVE Sensitive     * >=100,000 COLONIES/mL ESCHERICHIA COLI          Radiology Studies: EEG adult  Result Date: 04/29/21 Lora Havens, MD     2021/04/29  8:57 AM Patient Name: Jaquia Benedicto MRN: [id number] Epilepsy Attending: Lora Havens Referring Physician/Provider: Dr Hosie Poisson Date: 04/09/2021 Duration: 24.55 mins Patient history: 89y F with ams. EEG to evaluate for seizure Level of alertness: Awake AEDs during EEG study: GBP Technical aspects: This EEG study was done with scalp electrodes positioned  according to the 10-20 International system of electrode placement. Electrical activity was acquired at a sampling rate of 500Hz  and reviewed with a high frequency filter of 70Hz  and a low frequency filter of 1Hz . EEG data were recorded continuously and digitally stored. Description: The posterior dominant rhythm consists of 8-9 Hz activity of moderate voltage (25-35 uV) seen predominantly in posterior head regions, symmetric and reactive to eye opening and eye closing. EEG showed intermittent generalized 5 to 6 Hz theta-delta slowing. Hyperventilation and photic stimulation were not performed.   ABNORMALITY - Intermittent slow, generalized IMPRESSION: This study is suggestive of mild diffuse encephalopathy, nonspecific etiology. No seizures or epileptiform discharges were seen throughout the recording. Priyanka Barbra Sarks        Scheduled Meds:  acetaminophen  1,000 mg Oral TID   enoxaparin (LOVENOX) injection  40 mg Subcutaneous Q24H   feeding supplement  237 mL Oral BID BM   [START ON 63/12/9371] folic acid  1 mg Oral Daily   gabapentin  300 mg Oral BID   levothyroxine  137 mcg Oral Q0600   methylPREDNISolone (SOLU-MEDROL) injection  60 mg Intravenous Q24H   multivitamin with minerals  1 tablet Oral Daily   oseltamivir  30 mg Oral BID   traZODone  25 mg Oral QHS   Continuous Infusions:     LOS: 2 days       Hosie Poisson, MD [facility]   To contact the  attending provider between 7A-7P or the covering provider during after hours 7P-7A, please log into the web site [email or web address] and access using universal [place] password for that web site. If you do not have the password, please call the hospital operator.  04/10/2021, 3:01 PM

## 2021-04-10 NOTE — Progress Notes (Signed)
PHARMACIST - PHYSICIAN COMMUNICATION  DR:   Karleen Hampshire  CONCERNING: IV to Oral Route Change Policy  RECOMMENDATION: This patient is receiving folate by the intravenous route.  Based on criteria approved by the Pharmacy and Therapeutics Committee, the intravenous medication(s) is/are being converted to the equivalent oral dose form(s).   DESCRIPTION: These criteria include: The patient is eating (either orally or via tube) and/or has been taking other orally administered medications for a least 24 hours The patient has no evidence of active gastrointestinal bleeding or impaired GI absorption (gastrectomy, short bowel, patient on TNA or NPO).  If you have questions about this conversion, please contact the Pharmacy Department  []   [phone] )  [facility] []   [phone] )  [facility] []   [phone] )  [facility] []   [phone] )  [facility] [x]   [phone] )  Connell, PharmD 04/10/2021 12:41 PM

## 2021-04-10 NOTE — Progress Notes (Signed)
Palliative Care Progress Note  [name] is much improved today and completely out of wrist restraints. She responded very well to the medication regimen that was started yesterday and I recommend continuing this indefinitely. I suspect pain and discomfort was driving her prolonged delirium-she may also continue to improve with high dose B12 repletion via IM route-she tolerated the injection well. Her daughter shared with me that she doesn't do well with a lot of pills so getting a nutrition consult may be helpful in determining what types of nutritional supplements could help support her deficiencies such as shakes or liquid.  I discussed goals of care with her daughter at length today:  DNR, I obtained a copy of her ACP to be scanned into her chart. Continue Scheduled Tylenol TID, may be able to cut this back after discharge to once or twice daily. Continue Neurontin 300mg  BID Continue QHS low dose Trazadone Continue B12 Repletion and nutritional management.  Discharge to [facility] [place] with Palliative Care services to follow- will ask TOC to make that referral request.  Lane Hacker, DO Palliative Medicine  Time: 35 min Greater than 50%  of this time was spent counseling and coordinating care related to the above assessment and plan.

## 2021-04-11 LAB — T4, FREE: Free T4: 1.71 ng/dL — ABNORMAL HIGH (ref 0.61–1.12)

## 2021-04-11 MED ORDER — PREDNISONE 20 MG PO TABS: 40.0000 mg | ORAL_TABLET | Freq: Every day | ORAL | Status: DC

## 2021-04-11 MED ORDER — CYANOCOBALAMIN 1000 MCG/ML IJ SOLN
1000.0000 ug | Freq: Every day | INTRAMUSCULAR | Status: DC
  Administered 2021-04-11 – 2021-04-13 (×2): 1000 ug via INTRAMUSCULAR
  Filled 2021-04-11 (×4): qty 1

## 2021-04-11 NOTE — Progress Notes (Signed)
Patient pulled out her iv and became combative during care toward Nurse Techs, pulling tech's hair and grabbing at staff.

## 2021-04-11 NOTE — Progress Notes (Signed)
Patient pulling out purewick while in wrist restraints. Will continue to monitor patient until nightshift nurse takes over. PRN Haldol did not have positive effect on patient.

## 2021-04-11 NOTE — Progress Notes (Signed)
PROGRESS NOTE    Doris Gray  LPF:[id number] DOB: 1930/08/08 DOA: 04/07/2021 PCP: Dorothyann Peng, NP    Chief Complaint  Patient presents with   Altered Mental Status    Brief Narrative:  Doris Gray is a 87 y.o. female with history of hypertension, hypothyroidism was brought to the ER  for fever and confusion. She was found to have UTI and influenza A.  CXR unremarkable.   Assessment & Plan:   Principal Problem:   Acute encephalopathy Active Problems:   Splenic artery aneurysm ([name])   Hypertension   Hypothyroidism   UTI (urinary tract infection)   Acute cystitis without hematuria   Influenza A   Acute metabolic encephalopathy secondary to UTI and influenza A: Improving. Off 4 point restraints this am. She is alert and answering all questions appropriately.  - avoid narcotics and benzodiazepines.     E coli UTI; One dose of fosfomycin given.    Influenza A infection:  - complete the course of tamiflu.  - wheezing with rhonchi on exam., started her on IV steroids with improvement in breathing and wheezing. Transition to prednisone.  Continue to monitor.  - cough medication as needed.    Hypertension:  BP parameters are  suboptimal today, prn hydralazine on board.    Acute cystitis:  Cultures growing e coli resistant to rocephin and ampicillin.  ID recommends one dose of fosfomycin.     Hypothyroidism: Resume synthroid.    Elevated liver enzymes:  Liver enzymes are improving.  US abdomen showed  fatty liver.  Acute hepatitis panel is negative.  Recommend outpatient follow up with a liver panel in 4 weeks.    Therapy eval recommending SNF.  In view of advanced age, dementia, pallaitive care consulted for goals of care.     DVT prophylaxis: (Lovenox) Code Status: (DNR.) Family Communication: daughter at bedside.  Disposition:   Status is: Observation  The patient will require care spanning > 2 midnights and should be moved to  inpatient because: unsafe d/c plan.       Consultants:  Palliative care   Procedures: none.   Antimicrobials: . Antibiotics Given (last 72 hours)     Date/Time Action Medication Dose Rate   04/08/21 1627 New Bag/Given   cefTRIAXone (ROCEPHIN) 2 g in sodium chloride 0.9 % 100 mL IVPB 2 g 200 mL/hr   04/08/21 2139 Given   oseltamivir (TAMIFLU) capsule 30 mg 30 mg    04/09/21 0107 New Bag/Given   azithromycin (ZITHROMAX) 500 mg in sodium chloride 0.9 % 250 mL IVPB 500 mg 250 mL/hr   04/09/21 1006 Given   oseltamivir (TAMIFLU) capsule 30 mg 30 mg    04/09/21 1423 Given   fosfomycin (MONUROL) packet 3 g 3 g    04/09/21 2159 Given   oseltamivir (TAMIFLU) capsule 30 mg 30 mg    04/10/21 5329 Given   azithromycin (ZITHROMAX) tablet 500 mg 500 mg    04/10/21 1004 Given   oseltamivir (TAMIFLU) capsule 30 mg 30 mg    04/10/21 2238 Given   oseltamivir (TAMIFLU) capsule 30 mg 30 mg    04/11/21 9242 Given   oseltamivir (TAMIFLU) capsule 30 mg 30 mg          Subjective: No new complaints.   Objective: Vitals:   04/10/21 1241 04/10/21 2213 04/11/21 0644 04/11/21 1317  BP: 139/89 (!) 155/114 (!) 169/85 (!) 159/74  Pulse: 68 80 60 81  Resp: 19 16 20 16   Temp: 97.7 F (36.5 C) 97.9  F (36.6 C) (!) 97.5 F (36.4 C) 97.7 F (36.5 C)  TempSrc: Oral Oral Oral Oral  SpO2: 92% 91% 96% 95%  Weight:      Height:        Intake/Output Summary (Last 24 hours) at 04/11/2021 1358 Last data filed at 04/11/2021 1110 Gross per 24 hour  Intake 240 ml  Output --  Net 240 ml    Filed Weights   04/07/21 1138  Weight: 90.7 kg    Examination:  General exam: Elderly woman not in any distress Respiratory system: Wheezing has improved air entry fair no tachypnea Cardiovascular system: S1 & S2 heard, RRR. No JVD, . No pedal edema. Gastrointestinal system: Abdomen is nondistended, soft and nontender. Normal bowel sounds heard. Central nervous system: Alert and oriented. No focal  neurological deficits. Extremities: Symmetric 5 x 5 power. Skin: No rashes, lesions or ulcers Psychiatry:  Mood & affect appropriate.        Data Reviewed: I have personally reviewed following labs and imaging studies  CBC: Recent Labs  Lab 04/07/21 1143 04/07/21 2307 04/08/21 0514 04/09/21 0451  WBC 7.6 6.0 5.9 4.4  NEUTROABS  --   --  3.7  --   HGB 15.2* 13.8 13.5 13.9  HCT 46.7* 42.5 41.4 41.2  MCV 89.5 90.8 90.2 89.8  PLT 192 159 151 148*     Basic Metabolic Panel: Recent Labs  Lab 04/07/21 1143 04/07/21 2307 04/08/21 0514 04/09/21 0451  NA 135  --  138 139  K 4.3  --  3.8 3.6  CL 100  --  107 109  CO2 24  --  26 23  GLUCOSE 121*  --  99 90  BUN 15  --  13 12  CREATININE 0.93 0.88 0.76 0.63  CALCIUM 10.8*  --  9.0 8.9  MG 2.2  --   --   --      GFR: Estimated Creatinine Clearance: 52.9 mL/min (by C-G formula based on SCr of 0.63 mg/dL).  Liver Function Tests: Recent Labs  Lab 04/07/21 1143 04/08/21 0514  AST 59* 44*  ALT 48* 39  ALKPHOS 70 53  BILITOT 0.5 0.4  PROT 7.6 6.1*  ALBUMIN 4.8 3.4*     CBG: Recent Labs  Lab 04/07/21 1245  GLUCAP 98      Recent Results (from the past 240 hour(s))  MRSA Next Gen by PCR, Nasal     Status: None   Collection Time: 04/07/21 12:10 AM   Specimen: Nasal Mucosa; Nasal Swab  Result Value Ref Range Status   MRSA by PCR Next Gen NOT DETECTED NOT DETECTED Final    Comment: (NOTE) The GeneXpert MRSA Assay (FDA approved for NASAL specimens only), is one component of a comprehensive MRSA colonization surveillance program. It is not intended to diagnose MRSA infection nor to guide or monitor treatment for MRSA infections. Test performance is not FDA approved in patients less than 15 years old. Performed at [facility], [place] [address], [place], [place] [address]   Resp Panel by RT-PCR (Flu A&B, Covid) Nasopharyngeal Swab     Status: Abnormal   Collection Time: 04/07/21 11:53 AM    Specimen: Nasopharyngeal Swab; Nasopharyngeal(NP) swabs in vial transport medium  Result Value Ref Range Status   SARS Coronavirus 2 by RT PCR NEGATIVE NEGATIVE Final    Comment: (NOTE) SARS-CoV-2 target nucleic acids are NOT DETECTED.  The SARS-CoV-2 RNA is generally detectable in upper respiratory specimens during the acute phase of  infection. The lowest concentration of SARS-CoV-2 viral copies this assay can detect is 138 copies/mL. A negative result does not preclude SARS-Cov-2 infection and should not be used as the sole basis for treatment or other patient management decisions. A negative result may occur with  improper specimen collection/handling, submission of specimen other than nasopharyngeal swab, presence of viral mutation(s) within the areas targeted by this assay, and inadequate number of viral copies(<138 copies/mL). A negative result must be combined with clinical observations, patient history, and epidemiological information. The expected result is Negative.  Fact Sheet for Patients:  EntrepreneurPulse.com.au  Fact Sheet for Healthcare Providers:  IncredibleEmployment.be  This test is no t yet approved or cleared by the [place] FDA and  has been authorized for detection and/or diagnosis of SARS-CoV-2 by FDA under an Emergency Use Authorization (EUA). This EUA will remain  in effect (meaning this test can be used) for the duration of the COVID-19 declaration under Section 564(b)(1) of the Act, 21 U.S.C.section 360bbb-3(b)(1), unless the authorization is terminated  or revoked sooner.       Influenza A by PCR POSITIVE (A) NEGATIVE Final   Influenza B by PCR NEGATIVE NEGATIVE Final    Comment: (NOTE) The Xpert Xpress SARS-CoV-2/FLU/RSV plus assay is intended as an aid in the diagnosis of influenza from Nasopharyngeal swab specimens and should not be used as a sole basis for treatment. Nasal washings and aspirates are  unacceptable for Xpert Xpress SARS-CoV-2/FLU/RSV testing.  Fact Sheet for Patients: EntrepreneurPulse.com.au  Fact Sheet for Healthcare Providers: IncredibleEmployment.be  This test is not yet approved or cleared by the [place] FDA and has been authorized for detection and/or diagnosis of SARS-CoV-2 by FDA under an Emergency Use Authorization (EUA). This EUA will remain in effect (meaning this test can be used) for the duration of the COVID-19 declaration under Section 564(b)(1) of the Act, 21 U.S.C. section 360bbb-3(b)(1), unless the authorization is terminated or revoked.  Performed at [facility], [address] [place], [place] [address]   Blood Culture (routine x 2)     Status: None (Preliminary result)   Collection Time: 04/07/21 12:18 PM   Specimen: BLOOD  Result Value Ref Range Status   Specimen Description   Final    BLOOD RIGHT ANTECUBITAL Performed at [facility], [address], [place], [place] [address]    Special Requests   Final    BOTTLES DRAWN AEROBIC AND ANAEROBIC Blood Culture adequate volume Performed at [facility], [address], [place], [place] [address]    Culture   Final    NO GROWTH 4 DAYS Performed at [facility], [place] [address], [place], [place] [address]    Report Status PENDING  Incomplete  Blood Culture (routine x 2)     Status: None (Preliminary result)   Collection Time: 04/07/21 12:40 PM   Specimen: BLOOD LEFT ARM  Result Value Ref Range Status   Specimen Description   Final    BLOOD LEFT ARM Performed at [facility], [address] [place], [place] [address]    Special Requests   Final    Blood Culture adequate volume Performed at [facility], [address], [place], [place] [address]    Culture   Final    NO GROWTH 4 DAYS Performed at [facility], [place] [address] [place], [place] [address]    Report Status PENDING  Incomplete  Urine Culture     Status: Abnormal  Collection Time: 04/07/21  3:20 PM   Specimen: In/Out Cath Urine  Result Value Ref Range Status   Specimen Description   Final    IN/OUT CATH URINE Performed at [facility], [address], [place], [place] [address]    Special Requests   Final    NONE Performed at [facility], [address] [place], [place] [address]    Culture >=100,000 COLONIES/mL ESCHERICHIA COLI (A)  Final   Report Status 04/09/2021 FINAL  Final   Organism ID, Bacteria ESCHERICHIA COLI (A)  Final      Susceptibility   Escherichia coli - MIC*    AMPICILLIN >=32 RESISTANT Resistant     CEFAZOLIN >=64 RESISTANT Resistant     CEFEPIME <=0.12 SENSITIVE Sensitive     CEFTRIAXONE 32 RESISTANT Resistant     CIPROFLOXACIN <=0.25 SENSITIVE Sensitive     GENTAMICIN >=16 RESISTANT Resistant     IMIPENEM <=0.25 SENSITIVE Sensitive     NITROFURANTOIN <=16 SENSITIVE Sensitive     TRIMETH/SULFA <=20 SENSITIVE Sensitive     AMPICILLIN/SULBACTAM 16 INTERMEDIATE Intermediate     PIP/TAZO <=4 SENSITIVE Sensitive     * >=100,000 COLONIES/mL ESCHERICHIA COLI          Radiology Studies: EEG adult  Result Date: 05-04-21 Lora Havens, MD     May 04, 2021  8:57 AM Patient Name: Doris Gray MRN: [id number] Epilepsy Attending: Lora Havens Referring Physician/Provider: Dr Hosie Poisson Date: 04/09/2021 Duration: 24.55 mins Patient history: 89y F with ams. EEG to evaluate for seizure Level of alertness: Awake AEDs during EEG study: GBP Technical aspects: This EEG study was done with scalp electrodes positioned according to the 10-20 International system of electrode placement. Electrical activity was acquired at a sampling rate of 500Hz  and reviewed with a high frequency filter of 70Hz  and a low frequency filter of 1Hz . EEG data were recorded continuously and digitally stored.  Description: The posterior dominant rhythm consists of 8-9 Hz activity of moderate voltage (25-35 uV) seen predominantly in posterior head regions, symmetric and reactive to eye opening and eye closing. EEG showed intermittent generalized 5 to 6 Hz theta-delta slowing. Hyperventilation and photic stimulation were not performed.   ABNORMALITY - Intermittent slow, generalized IMPRESSION: This study is suggestive of mild diffuse encephalopathy, nonspecific etiology. No seizures or epileptiform discharges were seen throughout the recording. Priyanka Barbra Sarks        Scheduled Meds:  acetaminophen  1,000 mg Oral TID   cyanocobalamin  1,000 mcg Intramuscular Daily   enoxaparin (LOVENOX) injection  40 mg Subcutaneous Q24H   feeding supplement  237 mL Oral BID BM   folic acid  1 mg Oral Daily   gabapentin  300 mg Oral BID   levothyroxine  137 mcg Oral Q0600   methylPREDNISolone (SOLU-MEDROL) injection  60 mg Intravenous Q24H   multivitamin with minerals  1 tablet Oral Daily   oseltamivir  30 mg Oral BID   traZODone  25 mg Oral QHS   Continuous Infusions:     LOS: 3 days       Hosie Poisson, MD [facility]   To contact the attending provider between 7A-7P or the covering provider during after hours 7P-7A, please log into the web site [email or web address] and access using universal [place] password for that web site. If you do not have the password, please call the hospital operator.  04/11/2021, 1:58 PM

## 2021-04-12 LAB — CULTURE, BLOOD (ROUTINE X 2)
Culture: NO GROWTH
Culture: NO GROWTH
Special Requests: ADEQUATE
Special Requests: ADEQUATE

## 2021-04-12 MED ORDER — PREDNISONE 20 MG PO TABS
20.0000 mg | ORAL_TABLET | Freq: Every day | ORAL | Status: AC
  Administered 2021-04-13 – 2021-04-14 (×2): 20 mg via ORAL
  Filled 2021-04-12 (×2): qty 1

## 2021-04-12 MED ORDER — LEVOTHYROXINE SODIUM 125 MCG PO TABS
125.0000 ug | ORAL_TABLET | Freq: Every day | ORAL | Status: DC
  Administered 2021-04-13 – 2021-04-14 (×2): 125 ug via ORAL
  Filled 2021-04-12 (×2): qty 1

## 2021-04-12 NOTE — Progress Notes (Signed)
PROGRESS NOTE    Doris Gray  ZHG:[id number] DOB: 01-19-1931 DOA: 04/07/2021 PCP: Dorothyann Peng, NP    Chief Complaint  Patient presents with   Altered Mental Status    Brief Narrative:  Doris Gray is a 87 y.o. female with history of hypertension, hypothyroidism was brought to the ER  for fever and confusion. She was found to have UTI and influenza A.  CXR unremarkable.   Assessment & Plan:   Principal Problem:   Acute encephalopathy Active Problems:   Splenic artery aneurysm ([name])   Hypertension   Hypothyroidism   UTI (urinary tract infection)   Acute cystitis without hematuria   Influenza A   Acute metabolic encephalopathy secondary to UTI and influenza A In the setting of hyperthyroidism and vitamin b2 deficiency.  Overnight pt became agitated and started hitting the staff and had to be restrained.  Currently she appears sedated.     E coli UTI; One dose of fosfomycin given.    Influenza A infection:  - complete the course of tamiflu.  - wheezing with rhonchi on exam., started her on IV steroids with improvement in breathing and wheezing. Transition to prednisone. Her wheezing has improved, remains on RA, .  Continue to monitor.  - cough medication as needed.    Hypertension:  BP parameters are elevated. Pt is on prn hydralazine.    Acute cystitis:  Cultures growing e coli resistant to rocephin and ampicillin.  ID recommends one dose of fosfomycin.     Hypothyroidism: TSH elevated, free t4 is elevated.  Decreased the dose of synthroid to 125 mcg daily.  Recommend 3 days of 137 mcg and 4 days of 125 mcg in a week .     Elevated liver enzymes:  Liver enzymes are improving.  US abdomen showed  fatty liver.  Acute hepatitis panel is negative.  Recommend outpatient follow up with a liver panel in 4 weeks.    Therapy eval recommending SNF.  In view of advanced age, dementia, pallaitive care consulted for goals of care.     DVT  prophylaxis: (Lovenox) Code Status: (DNR.) Family Communication: daughter at bedside.  Disposition:   Status is: Observation  The patient will require care spanning > 2 midnights and should be moved to inpatient because: unsafe d/c plan.       Consultants:  Palliative care   Procedures: none.   Antimicrobials: . Antibiotics Given (last 72 hours)     Date/Time Action Medication Dose   04/09/21 1423 Given   fosfomycin (MONUROL) packet 3 g 3 g   04/09/21 2159 Given   oseltamivir (TAMIFLU) capsule 30 mg 30 mg   04/10/21 1962 Given   azithromycin (ZITHROMAX) tablet 500 mg 500 mg   04/10/21 1004 Given   oseltamivir (TAMIFLU) capsule 30 mg 30 mg   04/10/21 2238 Given   oseltamivir (TAMIFLU) capsule 30 mg 30 mg   04/11/21 2297 Given   oseltamivir (TAMIFLU) capsule 30 mg 30 mg   04/11/21 2149 Given   oseltamivir (TAMIFLU) capsule 30 mg 30 mg         Subjective: Sleepy, pleasant.   Objective: Vitals:   04/11/21 0644 04/11/21 1317 04/11/21 2025 04/12/21 0630  BP: (!) 169/85 (!) 159/74 (!) 159/110 (!) 171/81  Pulse: 60 81 (!) 108 (!) 59  Resp: 20 16 16 20   Temp: (!) 97.5 F (36.4 C) 97.7 F (36.5 C) 97.7 F (36.5 C) (!) 97.3 F (36.3 C)  TempSrc: Oral Oral Oral Oral  SpO2: 96%  95% 96% 93%  Weight:      Height:        Intake/Output Summary (Last 24 hours) at 04/12/2021 1331 Last data filed at 04/12/2021 0900 Gross per 24 hour  Intake 120 ml  Output 850 ml  Net -730 ml    Filed Weights   04/07/21 1138  Weight: 90.7 kg    Examination:  General exam: elderly woman in restraints, not in distress.  Respiratory system: air entry fair, no wheezing heard. On RA.  Cardiovascular system: S1 & S2 heard, RRR. No JVD,  No pedal edema. Gastrointestinal system: Abdomen is nondistended, soft and nontender.  Normal bowel sounds heard. Central nervous system: Alert and oriented. No focal neurological deficits. Extremities: Symmetric 5 x 5 power. Skin: No rashes,  lesions or ulcers Psychiatry: Mood & affect appropriate.         Data Reviewed: I have personally reviewed following labs and imaging studies  CBC: Recent Labs  Lab 04/07/21 1143 04/07/21 2307 04/08/21 0514 04/09/21 0451  WBC 7.6 6.0 5.9 4.4  NEUTROABS  --   --  3.7  --   HGB 15.2* 13.8 13.5 13.9  HCT 46.7* 42.5 41.4 41.2  MCV 89.5 90.8 90.2 89.8  PLT 192 159 151 148*     Basic Metabolic Panel: Recent Labs  Lab 04/07/21 1143 04/07/21 2307 04/08/21 0514 04/09/21 0451  NA 135  --  138 139  K 4.3  --  3.8 3.6  CL 100  --  107 109  CO2 24  --  26 23  GLUCOSE 121*  --  99 90  BUN 15  --  13 12  CREATININE 0.93 0.88 0.76 0.63  CALCIUM 10.8*  --  9.0 8.9  MG 2.2  --   --   --      GFR: Estimated Creatinine Clearance: 52.9 mL/min (by C-G formula based on SCr of 0.63 mg/dL).  Liver Function Tests: Recent Labs  Lab 04/07/21 1143 04/08/21 0514  AST 59* 44*  ALT 48* 39  ALKPHOS 70 53  BILITOT 0.5 0.4  PROT 7.6 6.1*  ALBUMIN 4.8 3.4*     CBG: Recent Labs  Lab 04/07/21 1245  GLUCAP 98      Recent Results (from the past 240 hour(s))  MRSA Next Gen by PCR, Nasal     Status: None   Collection Time: 04/07/21 12:10 AM   Specimen: Nasal Mucosa; Nasal Swab  Result Value Ref Range Status   MRSA by PCR Next Gen NOT DETECTED NOT DETECTED Final    Comment: (NOTE) The GeneXpert MRSA Assay (FDA approved for NASAL specimens only), is one component of a comprehensive MRSA colonization surveillance program. It is not intended to diagnose MRSA infection nor to guide or monitor treatment for MRSA infections. Test performance is not FDA approved in patients less than 67 years old. Performed at [facility], [place] [address] [place], [place] [address]   Resp Panel by RT-PCR (Flu A&B, Covid) Nasopharyngeal Swab     Status: Abnormal   Collection Time: 04/07/21 11:53 AM   Specimen: Nasopharyngeal Swab; Nasopharyngeal(NP) swabs in vial transport  medium  Result Value Ref Range Status   SARS Coronavirus 2 by RT PCR NEGATIVE NEGATIVE Final    Comment: (NOTE) SARS-CoV-2 target nucleic acids are NOT DETECTED.  The SARS-CoV-2 RNA is generally detectable in upper respiratory specimens during the acute phase of infection. The lowest concentration of SARS-CoV-2 viral copies this assay can detect is 138 copies/mL. A  negative result does not preclude SARS-Cov-2 infection and should not be used as the sole basis for treatment or other patient management decisions. A negative result may occur with  improper specimen collection/handling, submission of specimen other than nasopharyngeal swab, presence of viral mutation(s) within the areas targeted by this assay, and inadequate number of viral copies(<138 copies/mL). A negative result must be combined with clinical observations, patient history, and epidemiological information. The expected result is Negative.  Fact Sheet for Patients:  EntrepreneurPulse.com.au  Fact Sheet for Healthcare Providers:  IncredibleEmployment.be  This test is no t yet approved or cleared by the [place] FDA and  has been authorized for detection and/or diagnosis of SARS-CoV-2 by FDA under an Emergency Use Authorization (EUA). This EUA will remain  in effect (meaning this test can be used) for the duration of the COVID-19 declaration under Section 564(b)(1) of the Act, 21 U.S.C.section 360bbb-3(b)(1), unless the authorization is terminated  or revoked sooner.       Influenza A by PCR POSITIVE (A) NEGATIVE Final   Influenza B by PCR NEGATIVE NEGATIVE Final    Comment: (NOTE) The Xpert Xpress SARS-CoV-2/FLU/RSV plus assay is intended as an aid in the diagnosis of influenza from Nasopharyngeal swab specimens and should not be used as a sole basis for treatment. Nasal washings and aspirates are unacceptable for Xpert Xpress SARS-CoV-2/FLU/RSV testing.  Fact Sheet for  Patients: EntrepreneurPulse.com.au  Fact Sheet for Healthcare Providers: IncredibleEmployment.be  This test is not yet approved or cleared by the [place] FDA and has been authorized for detection and/or diagnosis of SARS-CoV-2 by FDA under an Emergency Use Authorization (EUA). This EUA will remain in effect (meaning this test can be used) for the duration of the COVID-19 declaration under Section 564(b)(1) of the Act, 21 U.S.C. section 360bbb-3(b)(1), unless the authorization is terminated or revoked.  Performed at [facility], [address] [place], [place] [address]   Blood Culture (routine x 2)     Status: None   Collection Time: 04/07/21 12:18 PM   Specimen: BLOOD  Result Value Ref Range Status   Specimen Description   Final    BLOOD RIGHT ANTECUBITAL Performed at [facility], [address] [place], [place] [address]    Special Requests   Final    BOTTLES DRAWN AEROBIC AND ANAEROBIC Blood Culture adequate volume Performed at [facility], [address], [place], [place] [address]    Culture   Final    NO GROWTH 5 DAYS Performed at [facility], [place] [address] [place], [place] [address]    Report Status 04/12/2021 FINAL  Final  Blood Culture (routine x 2)     Status: None   Collection Time: 04/07/21 12:40 PM   Specimen: BLOOD LEFT ARM  Result Value Ref Range Status   Specimen Description   Final    BLOOD LEFT ARM Performed at [facility], [address] [place], [place] [address]    Special Requests   Final    Blood Culture adequate volume Performed at [facility], [address] [place], [place] [address]    Culture   Final    NO GROWTH 5 DAYS Performed at [facility], [place] [address] [place], [place] [address]    Report Status 04/12/2021 FINAL  Final  Urine Culture     Status: Abnormal    Collection Time: 04/07/21  3:20 PM   Specimen: In/Out Cath Urine  Result Value Ref Range  Status   Specimen Description   Final    IN/OUT CATH URINE Performed at [facility], [address], [place], [place] [address]    Special Requests   Final    NONE Performed at [facility], [address], [place], [place] [address]    Culture >=100,000 COLONIES/mL ESCHERICHIA COLI (A)  Final   Report Status 04/09/2021 FINAL  Final   Organism ID, Bacteria ESCHERICHIA COLI (A)  Final      Susceptibility   Escherichia coli - MIC*    AMPICILLIN >=32 RESISTANT Resistant     CEFAZOLIN >=64 RESISTANT Resistant     CEFEPIME <=0.12 SENSITIVE Sensitive     CEFTRIAXONE 32 RESISTANT Resistant     CIPROFLOXACIN <=0.25 SENSITIVE Sensitive     GENTAMICIN >=16 RESISTANT Resistant     IMIPENEM <=0.25 SENSITIVE Sensitive     NITROFURANTOIN <=16 SENSITIVE Sensitive     TRIMETH/SULFA <=20 SENSITIVE Sensitive     AMPICILLIN/SULBACTAM 16 INTERMEDIATE Intermediate     PIP/TAZO <=4 SENSITIVE Sensitive     * >=100,000 COLONIES/mL ESCHERICHIA COLI          Radiology Studies: No results found.      Scheduled Meds:  acetaminophen  1,000 mg Oral TID   cyanocobalamin  1,000 mcg Intramuscular Daily   enoxaparin (LOVENOX) injection  40 mg Subcutaneous Q24H   feeding supplement  237 mL Oral BID BM   folic acid  1 mg Oral Daily   gabapentin  300 mg Oral BID   [START ON 04/13/2021] levothyroxine  125 mcg Oral QAC breakfast   multivitamin with minerals  1 tablet Oral Daily   oseltamivir  30 mg Oral BID   predniSONE  40 mg Oral QAC breakfast   traZODone  25 mg Oral QHS   Continuous Infusions:     LOS: 4 days       Hosie Poisson, MD [facility]   To contact the attending provider between 7A-7P or the covering provider during after hours 7P-7A, please log into the web site [email or web address] and access using universal [place] password for that web site.  If you do not have the password, please call the hospital operator.  04/12/2021, 1:31 PM

## 2021-04-12 NOTE — Progress Notes (Signed)
Patient's family requested to speak with Palliative MD, patient still sleeping. Unable to give medications po. MD made aware.

## 2021-04-13 LAB — COMPREHENSIVE METABOLIC PANEL
ALT: 116 U/L — ABNORMAL HIGH (ref 0–44)
AST: 90 U/L — ABNORMAL HIGH (ref 15–41)
Albumin: 3.6 g/dL (ref 3.5–5.0)
Alkaline Phosphatase: 54 U/L (ref 38–126)
Anion gap: 4 — ABNORMAL LOW (ref 5–15)
BUN: 19 mg/dL (ref 8–23)
CO2: 26 mmol/L (ref 22–32)
Calcium: 9.5 mg/dL (ref 8.9–10.3)
Chloride: 107 mmol/L (ref 98–111)
Creatinine, Ser: 0.68 mg/dL (ref 0.44–1.00)
GFR, Estimated: >60 mL/min (ref >60)
Glucose, Bld: 126 mg/dL — ABNORMAL HIGH (ref 70–99)
Potassium: 4.2 mmol/L (ref 3.5–5.1)
Sodium: 137 mmol/L (ref 135–145)
Total Bilirubin: 0.6 mg/dL (ref 0.3–1.2)
Total Protein: 6.4 g/dL — ABNORMAL LOW (ref 6.5–8.1)

## 2021-04-13 LAB — T3, FREE: T3, Free: 1.9 pg/mL — ABNORMAL LOW (ref 2.0–4.4)

## 2021-04-13 LAB — CBC
HCT: 44.5 % (ref 36.0–46.0)
Hemoglobin: 15 g/dL (ref 12.0–15.0)
MCH: 29.8 pg (ref 26.0–34.0)
MCHC: 33.7 g/dL (ref 30.0–36.0)
MCV: 88.3 fL (ref 80.0–100.0)
Platelets: 230 10*3/uL (ref 150–400)
RBC: 5.04 MIL/uL (ref 3.87–5.11)
RDW: 13.2 % (ref 11.5–15.5)
WBC: 9.8 10*3/uL (ref 4.0–10.5)
nRBC: 0 % (ref 0.0–0.2)

## 2021-04-13 NOTE — Progress Notes (Signed)
Palliative Care Progress Note  I spoke with patient's daughter [name] at length over the weekend. The hospital setting is a very difficult place to manage acute delirium and really the only way to know what her new baseline will be is to get her back into her familiar setting. [name] is willing to hire private duty care givers to keep her mother safe and fully assisted following hospitalization.  Recommend continuing recommended medications for her comfort and management of agitation. Avoid using IV antipsychotics unless absolutely necessary for her safety.  Recommend use of a clonidine patch for her hypertension-this could have added benefits for her agitation and stress response.  Will continue to follow and adjust medication as needed. Continue nursing interventions designed at reducing delirium.   See Goals of Care update under ACP in Epic. DNR, Avoid rehospitalization, palliative care referral at discharge with possible transition into hospice if she doesn't improve following discharge.  Lane Hacker, DO Palliative Medicine  Time: 35 min Greater than 50%  of this time was spent counseling and coordinating care related to the above assessment and plan.

## 2021-04-13 NOTE — Progress Notes (Signed)
PT Cancellation Note  Patient Details Name: Levern Kalka MRN: [id number] DOB: 1930-07-10   Cancelled Treatment:    Reason Eval/Treat Not Completed: Fatigue/lethargy limiting ability to participate, patient sleeping soundly. Has been up in recliner and has ambulated with assistance  to BR. Will check back another time. [place] Pager [phone] Office [phone]     Claretha Cooper 04/13/2021, 3:11 PM

## 2021-04-13 NOTE — Progress Notes (Signed)
PROGRESS NOTE    Doris Gray  XNT:[id number] DOB: 02/28/1931 DOA: 04/07/2021 PCP: Dorothyann Peng, NP    Chief Complaint  Patient presents with   Altered Mental Status    Brief Narrative:  Doris Gray is a 87 y.o. female with history of hypertension, hypothyroidism was brought to the ER  for fever and confusion. She was found to have UTI and influenza A.  CXR unremarkable.   Assessment & Plan:   Principal Problem:   Acute encephalopathy Active Problems:   Splenic artery aneurysm ([name])   Hypertension   Hypothyroidism   UTI (urinary tract infection)   Acute cystitis without hematuria   Influenza A   Acute metabolic encephalopathy secondary to UTI and influenza A In the setting of hyperthyroidism and vitamin b2 deficiency.  She is calmer today, in the presence of family.  She Is still in restraints.    E coli UTI; One dose of fosfomycin given.    Influenza A infection:  - complete the course of tamiflu.  - wheezing with rhonchi on exam., started her on IV steroids with improvement in breathing and wheezing. Transition to prednisone.no wheezing heard on exam today. Try to wean her off steroids.  Continue to monitor.  - cough medication as needed.     Hypertension:  BP parameters are elevated. Pt is on prn hydralazine.  Will add clonidine if needed.    Acute cystitis:  Cultures growing e coli resistant to rocephin and ampicillin.  ID recommends one dose of fosfomycin.     Hypothyroidism: TSH elevated, free t4 is elevated.  Decreased the dose of synthroid to 125 mcg daily.  Recommend 3 days of 137 mcg and 4 days of 125 mcg in a week .     Elevated liver enzymes:  Liver enzymes are improving.  US abdomen showed  fatty liver.  Acute hepatitis panel is negative.  Recommend outpatient follow up with a liver panel in 4 weeks.    Therapy eval recommending SNF.  In view of advanced age, dementia, pallaitive care consulted for goals of care.      DVT prophylaxis: (Lovenox) Code Status: (DNR.) Family Communication: daughter at bedside.  Disposition:   Status is: Observation  The patient will require care spanning > 2 midnights and should be moved to inpatient because: unsafe d/c plan.  Daughter thinking of taking her mom home with outpatient palliative care services. Getting care givers scheduled meanwhile.       Consultants:  Palliative care   Procedures: none.   Antimicrobials: . Antibiotics Given (last 72 hours)     Date/Time Action Medication Dose   04/10/21 2238 Given   oseltamivir (TAMIFLU) capsule 30 mg 30 mg   04/11/21 9675 Given   oseltamivir (TAMIFLU) capsule 30 mg 30 mg   04/11/21 2149 Given   oseltamivir (TAMIFLU) capsule 30 mg 30 mg         Subjective: Sleepy, not in distress.   Objective: Vitals:   04/12/21 1400 04/12/21 2021 04/13/21 0544 04/13/21 0656  BP: (!) 150/75 (!) 169/92 (!) 174/78 (!) 153/74  Pulse: (!) 58 79 (!) 53 (!) 59  Resp: 16 20 20    Temp: 98.1 F (36.7 C) 98 F (36.7 C) (!) 97.5 F (36.4 C)   TempSrc:  Oral Oral   SpO2: 96% 94% 95% 98%  Weight:      Height:        Intake/Output Summary (Last 24 hours) at 04/13/2021 1634 Last data filed at 04/13/2021 0850 Gross per  24 hour  Intake 110 ml  Output 2000 ml  Net -1890 ml    Filed Weights   04/07/21 1138  Weight: 90.7 kg    Examination:  General exam: elderly woman, not in distress.  Respiratory system: air entry fair.  Cardiovascular system: S1 & S2 heard, RRR. No JVD, . No pedal edema. Gastrointestinal system: Abdomen is nondistended, soft and nontender. Normal bowel sounds heard. Central nervous system: lethargic, but able to answer simple questions,  Extremities: Symmetric 5 x 5 power. Skin: No rashes, lesions or ulcers Psychiatry: calm but in restraints.          Data Reviewed: I have personally reviewed following labs and imaging studies  CBC: Recent Labs  Lab 04/07/21 1143  04/07/21 2307 04/08/21 0514 04/09/21 0451 04/13/21 0451  WBC 7.6 6.0 5.9 4.4 9.8  NEUTROABS  --   --  3.7  --   --   HGB 15.2* 13.8 13.5 13.9 15.0  HCT 46.7* 42.5 41.4 41.2 44.5  MCV 89.5 90.8 90.2 89.8 88.3  PLT 192 159 151 148* 230     Basic Metabolic Panel: Recent Labs  Lab 04/07/21 1143 04/07/21 2307 04/08/21 0514 04/09/21 0451 04/13/21 0451  NA 135  --  138 139 137  K 4.3  --  3.8 3.6 4.2  CL 100  --  107 109 107  CO2 24  --  26 23 26   GLUCOSE 121*  --  99 90 126*  BUN 15  --  13 12 19   CREATININE 0.93 0.88 0.76 0.63 0.68  CALCIUM 10.8*  --  9.0 8.9 9.5  MG 2.2  --   --   --   --      GFR: Estimated Creatinine Clearance: 52.9 mL/min (by C-G formula based on SCr of 0.68 mg/dL).  Liver Function Tests: Recent Labs  Lab 04/07/21 1143 04/08/21 0514 04/13/21 0451  AST 59* 44* 90*  ALT 48* 39 116*  ALKPHOS 70 53 54  BILITOT 0.5 0.4 0.6  PROT 7.6 6.1* 6.4*  ALBUMIN 4.8 3.4* 3.6     CBG: Recent Labs  Lab 04/07/21 1245  GLUCAP 98      Recent Results (from the past 240 hour(s))  MRSA Next Gen by PCR, Nasal     Status: None   Collection Time: 04/07/21 12:10 AM   Specimen: Nasal Mucosa; Nasal Swab  Result Value Ref Range Status   MRSA by PCR Next Gen NOT DETECTED NOT DETECTED Final    Comment: (NOTE) The GeneXpert MRSA Assay (FDA approved for NASAL specimens only), is one component of a comprehensive MRSA colonization surveillance program. It is not intended to diagnose MRSA infection nor to guide or monitor treatment for MRSA infections. Test performance is not FDA approved in patients less than 43 years old. Performed at [facility], [place] [address], [place], [place] [address]   Resp Panel by RT-PCR (Flu A&B, Covid) Nasopharyngeal Swab     Status: Abnormal   Collection Time: 04/07/21 11:53 AM   Specimen: Nasopharyngeal Swab; Nasopharyngeal(NP) swabs in vial transport medium  Result Value Ref Range Status   SARS Coronavirus 2  by RT PCR NEGATIVE NEGATIVE Final    Comment: (NOTE) SARS-CoV-2 target nucleic acids are NOT DETECTED.  The SARS-CoV-2 RNA is generally detectable in upper respiratory specimens during the acute phase of infection. The lowest concentration of SARS-CoV-2 viral copies this assay can detect is 138 copies/mL. A negative result does not preclude SARS-Cov-2 infection and should  not be used as the sole basis for treatment or other patient management decisions. A negative result may occur with  improper specimen collection/handling, submission of specimen other than nasopharyngeal swab, presence of viral mutation(s) within the areas targeted by this assay, and inadequate number of viral copies(<138 copies/mL). A negative result must be combined with clinical observations, patient history, and epidemiological information. The expected result is Negative.  Fact Sheet for Patients:  EntrepreneurPulse.com.au  Fact Sheet for Healthcare Providers:  IncredibleEmployment.be  This test is no t yet approved or cleared by the [place] FDA and  has been authorized for detection and/or diagnosis of SARS-CoV-2 by FDA under an Emergency Use Authorization (EUA). This EUA will remain  in effect (meaning this test can be used) for the duration of the COVID-19 declaration under Section 564(b)(1) of the Act, 21 U.S.C.section 360bbb-3(b)(1), unless the authorization is terminated  or revoked sooner.       Influenza A by PCR POSITIVE (A) NEGATIVE Final   Influenza B by PCR NEGATIVE NEGATIVE Final    Comment: (NOTE) The Xpert Xpress SARS-CoV-2/FLU/RSV plus assay is intended as an aid in the diagnosis of influenza from Nasopharyngeal swab specimens and should not be used as a sole basis for treatment. Nasal washings and aspirates are unacceptable for Xpert Xpress SARS-CoV-2/FLU/RSV testing.  Fact Sheet for Patients: EntrepreneurPulse.com.au  Fact  Sheet for Healthcare Providers: IncredibleEmployment.be  This test is not yet approved or cleared by the [place] FDA and has been authorized for detection and/or diagnosis of SARS-CoV-2 by FDA under an Emergency Use Authorization (EUA). This EUA will remain in effect (meaning this test can be used) for the duration of the COVID-19 declaration under Section 564(b)(1) of the Act, 21 U.S.C. section 360bbb-3(b)(1), unless the authorization is terminated or revoked.  Performed at [facility], [address] [place], [place] [address]   Blood Culture (routine x 2)     Status: None   Collection Time: 04/07/21 12:18 PM   Specimen: BLOOD  Result Value Ref Range Status   Specimen Description   Final    BLOOD RIGHT ANTECUBITAL Performed at [facility], [address] [place], [place] [address]    Special Requests   Final    BOTTLES DRAWN AEROBIC AND ANAEROBIC Blood Culture adequate volume Performed at [facility], [address] [place], [place] [address]    Culture   Final    NO GROWTH 5 DAYS Performed at [facility], [place] [address] [place], [place] [address]    Report Status 04/12/2021 FINAL  Final  Blood Culture (routine x 2)     Status: None   Collection Time: 04/07/21 12:40 PM   Specimen: BLOOD LEFT ARM  Result Value Ref Range Status   Specimen Description   Final    BLOOD LEFT ARM Performed at [facility], [address] [place], [place] [address]    Special Requests   Final    Blood Culture adequate volume Performed at [facility], [address], [place], [place] [address]    Culture   Final    NO GROWTH 5 DAYS Performed at [facility], [place] [address], [place],  [address]    Report Status 04/12/2021 FINAL  Final  Urine Culture     Status: Abnormal   Collection Time: 04/07/21  3:20 PM   Specimen: In/Out Cath Urine   Result Value Ref Range Status   Specimen Description   Final  IN/OUT CATH URINE Performed at [facility], [address] [place], [place] [address]    Special Requests   Final    NONE Performed at [facility], [address] [place], [place] [address]    Culture >=100,000 COLONIES/mL ESCHERICHIA COLI (A)  Final   Report Status 04/09/2021 FINAL  Final   Organism ID, Bacteria ESCHERICHIA COLI (A)  Final      Susceptibility   Escherichia coli - MIC*    AMPICILLIN >=32 RESISTANT Resistant     CEFAZOLIN >=64 RESISTANT Resistant     CEFEPIME <=0.12 SENSITIVE Sensitive     CEFTRIAXONE 32 RESISTANT Resistant     CIPROFLOXACIN <=0.25 SENSITIVE Sensitive     GENTAMICIN >=16 RESISTANT Resistant     IMIPENEM <=0.25 SENSITIVE Sensitive     NITROFURANTOIN <=16 SENSITIVE Sensitive     TRIMETH/SULFA <=20 SENSITIVE Sensitive     AMPICILLIN/SULBACTAM 16 INTERMEDIATE Intermediate     PIP/TAZO <=4 SENSITIVE Sensitive     * >=100,000 COLONIES/mL ESCHERICHIA COLI          Radiology Studies: No results found.      Scheduled Meds:  acetaminophen  1,000 mg Oral TID   cyanocobalamin  1,000 mcg Intramuscular Daily   enoxaparin (LOVENOX) injection  40 mg Subcutaneous Q24H   feeding supplement  237 mL Oral BID BM   folic acid  1 mg Oral Daily   gabapentin  300 mg Oral BID   levothyroxine  125 mcg Oral QAC breakfast   multivitamin with minerals  1 tablet Oral Daily   predniSONE  20 mg Oral QAC breakfast   traZODone  25 mg Oral QHS   Continuous Infusions:     LOS: 5 days       Hosie Poisson, MD [facility]   To contact the attending provider between 7A-7P or the covering provider during after hours 7P-7A, please log into the web site [email or web address] and access using universal  password for that web site. If you do not have the password, please call the hospital operator.  04/13/2021, 4:34 PM

## 2021-04-13 NOTE — Care Management Important Message (Signed)
Important Message  Patient Details IM Letter placed in room for Daughter  [name]. Name: Doris Gray MRN: [id number] Date of Birth: 1930-10-30   Medicare Important Message Given:  Yes     Kerin Salen 04/13/2021, 12:30 PM

## 2021-04-13 NOTE — Progress Notes (Signed)
   04/13/21 0544  Vitals  Temp (!) 97.5 F (36.4 C)  Temp Source Oral  BP (!) 174/78  MAP (mmHg) 108  BP Location Left Arm  BP Method Automatic  Patient Position (if appropriate) Lying  Pulse Rate (!) 53  Pulse Rate Source Monitor  Resp 20  Level of Consciousness  Level of Consciousness Responds to Voice  MEWS COLOR  MEWS Score Color Green  Oxygen Therapy  SpO2 95 %  O2 Device Room Air  MEWS Score  MEWS Temp 0  MEWS Systolic 0  MEWS Pulse 0  MEWS RR 0  MEWS LOC 1  MEWS Score 1  Pt is sleeping well. Notified on call of HR < 55 admin hydralazine for B/P > 160.

## 2021-04-14 ENCOUNTER — Other Ambulatory Visit (HOSPITAL_BASED_OUTPATIENT_CLINIC_OR_DEPARTMENT_OTHER): Payer: Self-pay

## 2021-04-14 LAB — SARS CORONAVIRUS 2 (TAT 6-24 HRS): SARS Coronavirus 2: NEGATIVE

## 2021-04-14 LAB — CREATININE, SERUM
Creatinine, Ser: 0.74 mg/dL (ref 0.44–1.00)
GFR, Estimated: >60 mL/min (ref >60)

## 2021-04-14 MED ORDER — TRAZODONE HCL 50 MG PO TABS
25.0000 mg | ORAL_TABLET | Freq: Every day | ORAL | 0 refills | Status: DC
  Filled 2021-04-14: qty 20, 40d supply, fill #0

## 2021-04-14 MED ORDER — GABAPENTIN 300 MG PO CAPS
300.0000 mg | ORAL_CAPSULE | Freq: Two times a day (BID) | ORAL | 2 refills | Status: DC
  Filled 2021-04-14: qty 60, 30d supply, fill #0

## 2021-04-14 MED ORDER — LEVOTHYROXINE SODIUM 125 MCG PO TABS
125.0000 ug | ORAL_TABLET | ORAL | 2 refills | Status: DC
Start: 2021-04-14 — End: 2022-06-17
  Filled 2021-04-14: qty 48, 84d supply, fill #0

## 2021-04-14 MED ORDER — ENSURE ENLIVE PO LIQD
237.0000 mL | Freq: Two times a day (BID) | ORAL | 12 refills | Status: DC
  Filled 2021-04-14: qty 237, 1d supply, fill #0

## 2021-04-14 MED ORDER — LEVOTHYROXINE SODIUM 137 MCG PO TABS
137.0000 ug | ORAL_TABLET | ORAL | 3 refills | Status: AC
  Filled 2021-04-14: qty 36, 84d supply, fill #0

## 2021-04-14 MED ORDER — CEROVITE SENIOR PO TABS
1.0000 | ORAL_TABLET | Freq: Every day | ORAL | 0 refills | Status: DC
  Filled 2021-04-14: qty 60, 60d supply, fill #0

## 2021-04-14 MED ORDER — FOLIC ACID 1 MG PO TABS
1.0000 mg | ORAL_TABLET | Freq: Every day | ORAL | 1 refills | Status: DC
Start: 2021-04-14 — End: 2022-07-21
  Filled 2021-04-14: qty 30, 30d supply, fill #0

## 2021-04-14 NOTE — NC FL2 (Signed)
[place] LEVEL OF CARE SCREENING TOOL     IDENTIFICATION  Patient Name: Doris Gray Birthdate: [birth year] Sex: female Admission Date (Current Location): 04/07/2021  [facility] and [place] Number:  Herbalist and Address:  [facility],  [place], [place]      Provider Number: [id number]  Attending Physician Name and Address:  Hosie Poisson, MD  Relative Name and Phone Number:  Gaetana Michaelis (Daughter)   [phone]    Current Level of Care: Hospital Recommended Level of Care: [place] Prior Approval Number:    Date Approved/Denied:   PASRR Number: [id number]  Discharge Plan: Domiciliary (Rest home) ([facility])    Current Diagnoses: Patient Active Problem List   Diagnosis Date Noted   UTI (urinary tract infection) 04/07/2021   Acute encephalopathy 04/07/2021   Acute cystitis without hematuria 04/07/2021   Influenza A 04/07/2021   Splenic artery aneurysm ([name])    Hypertension    Hypothyroidism    Melanoma ([place])     Orientation RESPIRATION BLADDER Height & Weight     Self  Normal Continent, External catheter Weight: 90.7 kg Height:  5\' 3"  (160 cm)  BEHAVIORAL SYMPTOMS/MOOD NEUROLOGICAL BOWEL NUTRITION STATUS      Continent  (regular)  AMBULATORY STATUS COMMUNICATION OF NEEDS Skin   Limited Assist Verbally Normal                       Personal Care Assistance Level of Assistance  Bathing, Feeding, Dressing Bathing Assistance: Maximum assistance Feeding assistance: Limited assistance Dressing Assistance: Limited assistance     Functional Limitations Info  Sight, Hearing, Speech Sight Info: Adequate Hearing Info: Adequate Speech Info: Adequate    SPECIAL CARE FACTORS FREQUENCY  PT (By licensed PT), OT (By licensed OT)     PT Frequency: 2X/W OT Frequency: 2X/W            Contractures Contractures Info: Not present    Additional Factors Info  Code Status,  Allergies Code Status Info: DNR Allergies Info: Amlodipine   Codeine   Statins   Sulfa Antibiotics   Sulfamethoxazole-trimethoprim   Sulfasalazine   Tetanus Toxoid           Current Medications (04/14/2021):  This is the current hospital active medication list Current Facility-Administered Medications  Medication Dose Route Frequency Provider Last Rate Last Admin   acetaminophen (TYLENOL) tablet 650 mg  650 mg Oral Q6H PRN Rise Patience, MD       Or   acetaminophen (TYLENOL) suppository 650 mg  650 mg Rectal Q6H PRN Rise Patience, MD       acetaminophen (TYLENOL) tablet 1,000 mg  1,000 mg Oral TID Lane Hacker L, DO   1,000 mg at 04/13/21 2228   cyanocobalamin ((VITAMIN B-12)) injection 1,000 mcg  1,000 mcg Intramuscular Daily Hosie Poisson, MD   1,000 mcg at 04/13/21 1047   enoxaparin (LOVENOX) injection 40 mg  40 mg Subcutaneous Q24H Rise Patience, MD   40 mg at 04/13/21 1046   feeding supplement (ENSURE ENLIVE / ENSURE PLUS) liquid 237 mL  237 mL Oral BID BM Hosie Poisson, MD   237 mL at 45/80/99 8338   folic acid (FOLVITE) tablet 1 mg  1 mg Oral Daily Dimple Nanas, RPH   1 mg at 04/13/21 1045   gabapentin (NEURONTIN) capsule 300 mg  300 mg Oral BID Lane Hacker L, DO   300 mg at 04/13/21 2308  hydrALAZINE (APRESOLINE) injection 10 mg  10 mg Intravenous Q4H PRN Rise Patience, MD       ipratropium-albuterol (DUONEB) 0.5-2.5 (3) MG/3ML nebulizer solution 3 mL  3 mL Nebulization Q4H PRN Hosie Poisson, MD       levothyroxine (SYNTHROID) tablet 125 mcg  125 mcg Oral QAC breakfast Hosie Poisson, MD   125 mcg at 04/14/21 0548   multivitamin with minerals tablet 1 tablet  1 tablet Oral Daily Hosie Poisson, MD   1 tablet at 04/13/21 1045   predniSONE (DELTASONE) tablet 20 mg  20 mg Oral QAC breakfast Hosie Poisson, MD   20 mg at 04/13/21 0846   traZODone (DESYREL) tablet 25 mg  25 mg Oral QHS Lane Hacker L, DO   25 mg at 04/13/21 2228      Discharge Medications: Please see discharge summary for a list of discharge medications.  Relevant Imaging Results:  Relevant Lab Results:   Additional Information [id number]  [place], [place]

## 2021-04-14 NOTE — Progress Notes (Signed)
Publishing copy [facility]) Hospital Liaison Note  Notified by [facility] manager of patient/family request for [facility] palliative services at home after discharge.   [facility] hospital liaison will follow patient for discharge disposition.   Please call with any hospice or outpatient palliative care related questions.   Thank you for the opportunity to participate in this patient's care.   [facility] Liaison [phone]

## 2021-04-14 NOTE — Discharge Summary (Signed)
Physician Discharge Summary  Chanel Mcadams OMV:[id number] DOB: 09/25/1930 DOA: 04/07/2021  PCP: Dorothyann Peng, NP  Admit date: 04/07/2021 Discharge date: 04/14/2021  Admitted From: ALF Disposition:  ALF  Recommendations for Outpatient Follow-up:  Follow up with PCP in 1-2 weeks Please obtain BMP/CBC in one week Please follow up with outpatient palliative care follow up  Please follow up with Home health as recommended.   Discharge Condition:stable. CODE STATUS:DNR Diet recommendation: Heart Healthy   Brief/Interim Summary:  Doris Gray is a 87 y.o. female with history of hypertension, hypothyroidism was brought to the ER  for fever and confusion. She was found to have UTI and influenza A.  CXR unremarkable.    Discharge Diagnoses:  Principal Problem:   Acute encephalopathy Active Problems:   Splenic artery aneurysm ([name])   Hypertension   Hypothyroidism   UTI (urinary tract infection)   Acute cystitis without hematuria   Influenza A  Acute metabolic encephalopathy secondary to UTI and influenza A In the setting of hyperthyroidism and vitamin b2 deficiency.  She is calmer today, in the presence of family.       E coli UTI; One dose of fosfomycin given.      Influenza A infection:  - complete the course of tamiflu.  - wheezing with rhonchi on exam., started her on IV steroids with improvement in breathing and wheezing. Transition to prednisone for another 2 days. .no wheezing heard on exam today. - cough medication as needed.        Hypertension:  BP parameters are optimal.       Acute cystitis:  Cultures growing e coli resistant to rocephin and ampicillin.  ID recommends one dose of fosfomycin.        Hypothyroidism: TSH elevated, free t4 is elevated.  Decreased the dose of synthroid to 125 mcg daily.  Recommend 3 days of 137 mcg and 4 days of 125 mcg in a week .        Elevated liver enzymes:  Liver enzymes are improving.  US abdomen  showed  fatty liver.  Acute hepatitis panel is negative.  Recommend outpatient follow up with a liver panel in 4 weeks.      Therapy eval recommending SNF.  In view of advanced age, dementia, pallaitive care consulted for goals of care.       Discharge Instructions  Discharge Instructions     Diet - low sodium heart healthy   Complete by: As directed    Discharge instructions   Complete by: As directed    Please follow up with palliative care services outpatient.      Allergies as of 04/14/2021       Reactions   Amlodipine Other (See Comments)   Pt denies   Codeine    Statins    Myalgia    Sulfa Antibiotics    Sulfamethoxazole-trimethoprim Diarrhea   AKA Bactrim   Sulfasalazine Other (See Comments), Diarrhea   Tetanus Toxoid Swelling   Had arm swelling  Pt denies        Medication List     TAKE these medications    Cerovite Senior Tabs Take 1 tablet by mouth daily.   feeding supplement Liqd Take 237 mLs by mouth 2 (two) times daily between meals.   fexofenadine 180 MG tablet Commonly known as: ALLEGRA Take 180 mg by mouth daily.   fluticasone 50 MCG/ACT nasal spray Commonly known as: FLONASE Place 1 spray into both nostrils 2 (two) times daily.   folic acid  1 MG tablet Commonly known as: FOLVITE Take 1 tablet (1 mg total) by mouth daily.   gabapentin 300 MG capsule Commonly known as: NEURONTIN Take 1 capsule (300 mg total) by mouth 2 (two) times daily.   guaiFENesin 600 MG 12 hr tablet Commonly known as: MUCINEX Take 600 mg by mouth 2 (two) times daily.   levothyroxine 125 MCG tablet Commonly known as: SYNTHROID Take 1 tablet (125 mcg total) by mouth every Tuesday, Thursday, Saturday, and Sunday. What changed: You were already taking a medication with the same name, and this prescription was added. Make sure you understand how and when to take each.   levothyroxine 137 MCG tablet Commonly known as: SYNTHROID Take 1 tablet (137 mcg total)  by mouth every Monday, Wednesday, and Friday. Start taking on: April 15, 2021 What changed: when to take this   losartan 50 MG tablet Commonly known as: COZAAR Take 1 tablet (50 mg total) by mouth daily.   traZODone 50 MG tablet Commonly known as: DESYREL Take 0.5 tablets (25 mg total) by mouth at bedtime.        Follow-up Information     Nafziger, Cory, NP. Schedule an appointment as soon as possible for a visit in 1 week(s).   Specialty: Family Medicine Contact information: [address] [place] [address] [phone]                Allergies  Allergen Reactions   Amlodipine Other (See Comments)    Pt denies    Codeine    Statins     Myalgia    Sulfa Antibiotics    Sulfamethoxazole-Trimethoprim Diarrhea    AKA Bactrim   Sulfasalazine Other (See Comments) and Diarrhea   Tetanus Toxoid Swelling    Had arm swelling  Pt denies    Consultations: Palliative care   Procedures/Studies: CT Head Wo Contrast  Result Date: 04/07/2021 CLINICAL DATA:  Mental status change of unknown etiology. EXAM: CT HEAD WITHOUT CONTRAST TECHNIQUE: Contiguous axial images were obtained from the base of the skull through the vertex without intravenous contrast. COMPARISON:  None. FINDINGS: Brain: No evidence of acute infarction, hemorrhage, hydrocephalus, extra-axial collection or mass lesion/mass effect. There is moderate to severe diffuse low-attenuation within the subcortical and periventricular white matter compatible with chronic microvascular disease. Prominence of sulci and ventricles compatible with brain atrophy. Vascular: No hyperdense vessel or unexpected calcification. Skull: Normal. Negative for fracture or focal lesion. Sinuses/Orbits: No acute finding. Other: None IMPRESSION: 1. No acute intracranial abnormalities. 2. Chronic small vessel ischemic change and brain atrophy. Electronically Signed   By: Taylor  Stroud M.D.   On: 04/07/2021 13:23   DG Chest  Port 1 View  Result Date: 04/07/2021 CLINICAL DATA:  Altered mental status and difficulty walking for 3 days. EXAM: PORTABLE CHEST 1 VIEW COMPARISON:  None. FINDINGS: The cardiac silhouette, mediastinal and hilar contours are within normal limits for age. The lungs are clear of an acute infiltrate. No pleural effusions or obvious pulmonary lesions. IMPRESSION: No acute cardiopulmonary findings. Electronically Signed   By: P.  Gallerani M.D.   On: 04/07/2021 12:46   EEG adult  Result Date: 04/10/2021 Yadav, Priyanka O, MD     12 /06/2020  8:57 AM Patient Name: Doris Gray MRN: [id number] Epilepsy Attending: Lora Havens Referring Physician/Provider: Dr Hosie Poisson Date: 04/09/2021 Duration: 24.55 mins Patient history: 89y F with ams. EEG to evaluate for seizure Level of alertness: Awake AEDs during EEG study: GBP Technical aspects: This EEG study  was done with scalp electrodes positioned according to the 10-20 International system of electrode placement. Electrical activity was acquired at a sampling rate of 500Hz  and reviewed with a high frequency filter of 70Hz  and a low frequency filter of 1Hz . EEG data were recorded continuously and digitally stored. Description: The posterior dominant rhythm consists of 8-9 Hz activity of moderate voltage (25-35 uV) seen predominantly in posterior head regions, symmetric and reactive to eye opening and eye closing. EEG showed intermittent generalized 5 to 6 Hz theta-delta slowing. Hyperventilation and photic stimulation were not performed.   ABNORMALITY - Intermittent slow, generalized IMPRESSION: This study is suggestive of mild diffuse encephalopathy, nonspecific etiology. No seizures or epileptiform discharges were seen throughout the recording. Priyanka Barbra Sarks   US Abdomen Limited RUQ (LIVER/GB)  Result Date: 04/08/2021 CLINICAL DATA:  Elevated liver enzymes. EXAM: ULTRASOUND ABDOMEN LIMITED RIGHT UPPER QUADRANT COMPARISON:  CT abdomen pelvis dated  06/21/2018. FINDINGS: Gallbladder: Cholecystectomy. Common bile duct: Diameter: 11 mm. Liver: There is diffuse increased liver echogenicity most commonly seen in the setting of fatty infiltration. Superimposed inflammation or fibrosis is not excluded. Clinical correlation is recommended. Portal vein is patent on color Doppler imaging with normal direction of blood flow towards the liver. Other: None. IMPRESSION: 1. Fatty liver. 2. Cholecystectomy. Electronically Signed   By: Anner Crete M.D.   On: 04/08/2021 00:27      Subjective: No new complaints.   Discharge Exam: Vitals:   04/13/21 1400 04/13/21 2047  BP: 130/80 (!) 136/92  Pulse: 80 95  Resp:  18  Temp: 98.2 F (36.8 C)   SpO2: 99% 93%   Vitals:   04/13/21 0544 04/13/21 0656 04/13/21 1400 04/13/21 2047  BP: (!) 174/78 (!) 153/74 130/80 (!) 136/92  Pulse: (!) 53 (!) 59 80 95  Resp: 20   18  Temp: (!) 97.5 F (36.4 C)  98.2 F (36.8 C)   TempSrc: Oral  Oral   SpO2: 95% 98% 99% 93%  Weight:      Height:        General: Pt is alert, awake, not in acute distress Cardiovascular: RRR, S1/S2 +, no rubs, no gallops Respiratory: CTA bilaterally, no wheezing, no rhonchi Abdominal: Soft, NT, ND, bowel sounds + Extremities: no edema, no cyanosis    The results of significant diagnostics from this hospitalization (including imaging, microbiology, ancillary and laboratory) are listed below for reference.     Microbiology: Recent Results (from the past 240 hour(s))  MRSA Next Gen by PCR, Nasal     Status: None   Collection Time: 04/07/21 12:10 AM   Specimen: Nasal Mucosa; Nasal Swab  Result Value Ref Range Status   MRSA by PCR Next Gen NOT DETECTED NOT DETECTED Final    Comment: (NOTE) The GeneXpert MRSA Assay (FDA approved for NASAL specimens only), is one component of a comprehensive MRSA colonization surveillance program. It is not intended to diagnose MRSA infection nor to guide or monitor treatment for MRSA  infections. Test performance is not FDA approved in patients less than 53 years old. Performed at [facility], [place] [address] [place], [place] [address]   Resp Panel by RT-PCR (Flu A&B, Covid) Nasopharyngeal Swab     Status: Abnormal   Collection Time: 04/07/21 11:53 AM   Specimen: Nasopharyngeal Swab; Nasopharyngeal(NP) swabs in vial transport medium  Result Value Ref Range Status   SARS Coronavirus 2 by RT PCR NEGATIVE NEGATIVE Final    Comment: (NOTE) SARS-CoV-2 target nucleic acids are  NOT DETECTED.  The SARS-CoV-2 RNA is generally detectable in upper respiratory specimens during the acute phase of infection. The lowest concentration of SARS-CoV-2 viral copies this assay can detect is 138 copies/mL. A negative result does not preclude SARS-Cov-2 infection and should not be used as the sole basis for treatment or other patient management decisions. A negative result may occur with  improper specimen collection/handling, submission of specimen other than nasopharyngeal swab, presence of viral mutation(s) within the areas targeted by this assay, and inadequate number of viral copies(<138 copies/mL). A negative result must be combined with clinical observations, patient history, and epidemiological information. The expected result is Negative.  Fact Sheet for Patients:  EntrepreneurPulse.com.au  Fact Sheet for Healthcare Providers:  IncredibleEmployment.be  This test is no t yet approved or cleared by the [place] FDA and  has been authorized for detection and/or diagnosis of SARS-CoV-2 by FDA under an Emergency Use Authorization (EUA). This EUA will remain  in effect (meaning this test can be used) for the duration of the COVID-19 declaration under Section 564(b)(1) of the Act, 21 U.S.C.section 360bbb-3(b)(1), unless the authorization is terminated  or revoked sooner.       Influenza A by PCR POSITIVE (A) NEGATIVE  Final   Influenza B by PCR NEGATIVE NEGATIVE Final    Comment: (NOTE) The Xpert Xpress SARS-CoV-2/FLU/RSV plus assay is intended as an aid in the diagnosis of influenza from Nasopharyngeal swab specimens and should not be used as a sole basis for treatment. Nasal washings and aspirates are unacceptable for Xpert Xpress SARS-CoV-2/FLU/RSV testing.  Fact Sheet for Patients: EntrepreneurPulse.com.au  Fact Sheet for Healthcare Providers: IncredibleEmployment.be  This test is not yet approved or cleared by the [place] FDA and has been authorized for detection and/or diagnosis of SARS-CoV-2 by FDA under an Emergency Use Authorization (EUA). This EUA will remain in effect (meaning this test can be used) for the duration of the COVID-19 declaration under Section 564(b)(1) of the Act, 21 U.S.C. section 360bbb-3(b)(1), unless the authorization is terminated or revoked.  Performed at [facility], [address], [place], [place] [address]   Blood Culture (routine x 2)     Status: None   Collection Time: 04/07/21 12:18 PM   Specimen: BLOOD  Result Value Ref Range Status   Specimen Description   Final    BLOOD RIGHT ANTECUBITAL Performed at [facility], [address], [place], [place] [address]    Special Requests   Final    BOTTLES DRAWN AEROBIC AND ANAEROBIC Blood Culture adequate volume Performed at [facility], [address], [place], [place] [address]    Culture   Final    NO GROWTH 5 DAYS Performed at [facility], [place] [address] [place], [place] [address]    Report Status 04/12/2021 FINAL  Final  Blood Culture (routine x 2)     Status: None   Collection Time: 04/07/21 12:40 PM   Specimen: BLOOD LEFT ARM  Result Value Ref Range Status   Specimen Description   Final    BLOOD LEFT ARM Performed at [facility], [address] [place],  [place] [address]    Special Requests   Final    Blood Culture adequate volume Performed at [facility], [address] [place], [place] [address]    Culture   Final    NO GROWTH 5 DAYS Performed at [facility], [place] [address] [place], [place] [address]  Report Status 04/12/2021 FINAL  Final  Urine Culture     Status: Abnormal   Collection Time: 04/07/21  3:20 PM   Specimen: In/Out Cath Urine  Result Value Ref Range Status   Specimen Description   Final    IN/OUT CATH URINE Performed at [facility], [address], [place], [place] [address]    Special Requests   Final    NONE Performed at [facility], [address] [place], [place] [address]    Culture >=100,000 COLONIES/mL ESCHERICHIA COLI (A)  Final   Report Status 04/09/2021 FINAL  Final   Organism ID, Bacteria ESCHERICHIA COLI (A)  Final      Susceptibility   Escherichia coli - MIC*    AMPICILLIN >=32 RESISTANT Resistant     CEFAZOLIN >=64 RESISTANT Resistant     CEFEPIME <=0.12 SENSITIVE Sensitive     CEFTRIAXONE 32 RESISTANT Resistant     CIPROFLOXACIN <=0.25 SENSITIVE Sensitive     GENTAMICIN >=16 RESISTANT Resistant     IMIPENEM <=0.25 SENSITIVE Sensitive     NITROFURANTOIN <=16 SENSITIVE Sensitive     TRIMETH/SULFA <=20 SENSITIVE Sensitive     AMPICILLIN/SULBACTAM 16 INTERMEDIATE Intermediate     PIP/TAZO <=4 SENSITIVE Sensitive     * >=100,000 COLONIES/mL ESCHERICHIA COLI  SARS CORONAVIRUS 2 (TAT 6-24 HRS) Nasopharyngeal Nasopharyngeal Swab     Status: None   Collection Time: 04/13/21 12:23 PM   Specimen: Nasopharyngeal Swab  Result Value Ref Range Status   SARS Coronavirus 2 NEGATIVE NEGATIVE Final    Comment: (NOTE) SARS-CoV-2 target nucleic acids are NOT DETECTED.  The SARS-CoV-2 RNA is generally detectable in upper and lower respiratory specimens during the acute phase of infection. Negative results do not preclude SARS-CoV-2 infection,  do not rule out co-infections with other pathogens, and should not be used as the sole basis for treatment or other patient management decisions. Negative results must be combined with clinical observations, patient history, and epidemiological information. The expected result is Negative.  Fact Sheet for Patients: SugarRoll.be  Fact Sheet for Healthcare Providers: https://www.woods-mathews.com/  This test is not yet approved or cleared by the [place] FDA and  has been authorized for detection and/or diagnosis of SARS-CoV-2 by FDA under an Emergency Use Authorization (EUA). This EUA will remain  in effect (meaning this test can be used) for the duration of the COVID-19 declaration under Se ction 564(b)(1) of the Act, 21 U.S.C. section 360bbb-3(b)(1), unless the authorization is terminated or revoked sooner.  Performed at [facility], [place] [address] [place], [place] [address]      Labs: BNP (last 3 results) No results for input(s): BNP in the last 8760 hours. Basic Metabolic Panel: Recent Labs  Lab 04/07/21 1143 04/07/21 2307 04/08/21 0514 04/09/21 0451 04/13/21 0451 04/14/21 0518  NA 135  --  138 139 137  --   K 4.3  --  3.8 3.6 4.2  --   CL 100  --  107 109 107  --   CO2 24  --  26 23 26   --   GLUCOSE 121*  --  99 90 126*  --   BUN 15  --  13 12 19   --   CREATININE 0.93 0.88 0.76 0.63 0.68 0.74  CALCIUM 10.8*  --  9.0 8.9 9.5  --   MG 2.2  --   --   --   --   --    Liver Function Tests: Recent Labs  Lab 04/07/21 1143 04/08/21 0514 04/13/21 0451  AST 59* 44* 90*  ALT 48* 39 116*  ALKPHOS 70 53 54  BILITOT 0.5 0.4 0.6  PROT 7.6 6.1* 6.4*  ALBUMIN 4.8 3.4* 3.6   No results for input(s): LIPASE, AMYLASE in the last 168 hours. Recent Labs  Lab 04/09/21 1032  AMMONIA 28   CBC: Recent Labs  Lab 04/07/21 1143 04/07/21 2307 04/08/21 0514 04/09/21 0451 04/13/21 0451  WBC 7.6 6.0 5.9 4.4 9.8   NEUTROABS  --   --  3.7  --   --   HGB 15.2* 13.8 13.5 13.9 15.0  HCT 46.7* 42.5 41.4 41.2 44.5  MCV 89.5 90.8 90.2 89.8 88.3  PLT 192 159 151 148* 230   Cardiac Enzymes: No results for input(s): CKTOTAL, CKMB, CKMBINDEX, TROPONINI in the last 168 hours. BNP: Invalid input(s): POCBNP CBG: Recent Labs  Lab 04/07/21 1245  GLUCAP 98   D-Dimer No results for input(s): DDIMER in the last 72 hours. Hgb A1c No results for input(s): HGBA1C in the last 72 hours. Lipid Profile No results for input(s): CHOL, HDL, LDLCALC, TRIG, CHOLHDL, LDLDIRECT in the last 72 hours. Thyroid function studies No results for input(s): TSH, T4TOTAL, T3FREE, THYROIDAB in the last 72 hours.  Invalid input(s): FREET3 Anemia work up No results for input(s): VITAMINB12, FOLATE, FERRITIN, TIBC, IRON, RETICCTPCT in the last 72 hours. Urinalysis    Component Value Date/Time   COLORURINE YELLOW 04/07/2021 1520   APPEARANCEUR CLEAR 04/07/2021 1520   LABSPEC 1.014 04/07/2021 1520   PHURINE 5.5 04/07/2021 1520   GLUCOSEU NEGATIVE 04/07/2021 1520   HGBUR TRACE (A) 04/07/2021 1520   BILIRUBINUR NEGATIVE 04/07/2021 1520   BILIRUBINUR neg 04/10/2019 1400   KETONESUR NEGATIVE 04/07/2021 1520   PROTEINUR NEGATIVE 04/07/2021 1520   UROBILINOGEN 0.2 04/10/2019 1400   NITRITE POSITIVE (A) 04/07/2021 1520   LEUKOCYTESUR LARGE (A) 04/07/2021 1520   Sepsis Labs Invalid input(s): PROCALCITONIN,  WBC,  LACTICIDVEN Microbiology Recent Results (from the past 240 hour(s))  MRSA Next Gen by PCR, Nasal     Status: None   Collection Time: 04/07/21 12:10 AM   Specimen: Nasal Mucosa; Nasal Swab  Result Value Ref Range Status   MRSA by PCR Next Gen NOT DETECTED NOT DETECTED Final    Comment: (NOTE) The GeneXpert MRSA Assay (FDA approved for NASAL specimens only), is one component of a comprehensive MRSA colonization surveillance program. It is not intended to diagnose MRSA infection nor to guide or monitor treatment for  MRSA infections. Test performance is not FDA approved in patients less than 68 years old. Performed at [facility], [place] [address], [place], [place] [address]   Resp Panel by RT-PCR (Flu A&B, Covid) Nasopharyngeal Swab     Status: Abnormal   Collection Time: 04/07/21 11:53 AM   Specimen: Nasopharyngeal Swab; Nasopharyngeal(NP) swabs in vial transport medium  Result Value Ref Range Status   SARS Coronavirus 2 by RT PCR NEGATIVE NEGATIVE Final    Comment: (NOTE) SARS-CoV-2 target nucleic acids are NOT DETECTED.  The SARS-CoV-2 RNA is generally detectable in upper respiratory specimens during the acute phase of infection. The lowest concentration of SARS-CoV-2 viral copies this assay can detect is 138 copies/mL. A negative result does not preclude SARS-Cov-2 infection and should not be used as the sole basis for treatment or other patient management decisions. A negative result may occur with  improper specimen collection/handling, submission of specimen other than nasopharyngeal swab, presence of viral mutation(s) within  the areas targeted by this assay, and inadequate number of viral copies(<138 copies/mL). A negative result must be combined with clinical observations, patient history, and epidemiological information. The expected result is Negative.  Fact Sheet for Patients:  EntrepreneurPulse.com.au  Fact Sheet for Healthcare Providers:  IncredibleEmployment.be  This test is no t yet approved or cleared by the [place] FDA and  has been authorized for detection and/or diagnosis of SARS-CoV-2 by FDA under an Emergency Use Authorization (EUA). This EUA will remain  in effect (meaning this test can be used) for the duration of the COVID-19 declaration under Section 564(b)(1) of the Act, 21 U.S.C.section 360bbb-3(b)(1), unless the authorization is terminated  or revoked sooner.       Influenza A by PCR POSITIVE (A)  NEGATIVE Final   Influenza B by PCR NEGATIVE NEGATIVE Final    Comment: (NOTE) The Xpert Xpress SARS-CoV-2/FLU/RSV plus assay is intended as an aid in the diagnosis of influenza from Nasopharyngeal swab specimens and should not be used as a sole basis for treatment. Nasal washings and aspirates are unacceptable for Xpert Xpress SARS-CoV-2/FLU/RSV testing.  Fact Sheet for Patients: EntrepreneurPulse.com.au  Fact Sheet for Healthcare Providers: IncredibleEmployment.be  This test is not yet approved or cleared by the [place] FDA and has been authorized for detection and/or diagnosis of SARS-CoV-2 by FDA under an Emergency Use Authorization (EUA). This EUA will remain in effect (meaning this test can be used) for the duration of the COVID-19 declaration under Section 564(b)(1) of the Act, 21 U.S.C. section 360bbb-3(b)(1), unless the authorization is terminated or revoked.  Performed at [facility], [address] [place], [place] [address]   Blood Culture (routine x 2)     Status: None   Collection Time: 04/07/21 12:18 PM   Specimen: BLOOD  Result Value Ref Range Status   Specimen Description   Final    BLOOD RIGHT ANTECUBITAL Performed at [facility], [address] [place], [place] [address]    Special Requests   Final    BOTTLES DRAWN AEROBIC AND ANAEROBIC Blood Culture adequate volume Performed at [facility], [address], [place], [place] [address]    Culture   Final    NO GROWTH 5 DAYS Performed at [facility], [place] [address] [place], [place] [address]    Report Status 04/12/2021 FINAL  Final  Blood Culture (routine x 2)     Status: None   Collection Time: 04/07/21 12:40 PM   Specimen: BLOOD LEFT ARM  Result Value Ref Range Status   Specimen Description   Final    BLOOD LEFT ARM Performed at [facility], [address],  [place], [place] [address]    Special Requests   Final    Blood Culture adequate volume Performed at [facility], [address] [place], [place] [address]    Culture   Final    NO GROWTH 5 DAYS Performed at [facility], [place] [address] [place],  [address]    Report Status 04/12/2021 FINAL  Final  Urine Culture     Status: Abnormal   Collection Time: 04/07/21  3:20 PM   Specimen: In/Out Cath Urine  Result Value Ref Range Status   Specimen Description   Final    IN/OUT CATH URINE Performed at [facility], [address], [place],  [address]    Special Requests   Final    NONE Performed at [facility], [phone]  [place], [place], [place] [address]    Culture >=100,000 COLONIES/mL ESCHERICHIA COLI (A)  Final   Report Status 04/09/2021 FINAL  Final   Organism ID, Bacteria ESCHERICHIA COLI (A)  Final      Susceptibility   Escherichia coli - MIC*    AMPICILLIN >=32 RESISTANT Resistant     CEFAZOLIN >=64 RESISTANT Resistant     CEFEPIME <=0.12 SENSITIVE Sensitive     CEFTRIAXONE 32 RESISTANT Resistant     CIPROFLOXACIN <=0.25 SENSITIVE Sensitive     GENTAMICIN >=16 RESISTANT Resistant     IMIPENEM <=0.25 SENSITIVE Sensitive     NITROFURANTOIN <=16 SENSITIVE Sensitive     TRIMETH/SULFA <=20 SENSITIVE Sensitive     AMPICILLIN/SULBACTAM 16 INTERMEDIATE Intermediate     PIP/TAZO <=4 SENSITIVE Sensitive     * >=100,000 COLONIES/mL ESCHERICHIA COLI  SARS CORONAVIRUS 2 (TAT 6-24 HRS) Nasopharyngeal Nasopharyngeal Swab     Status: None   Collection Time: 04/13/21 12:23 PM   Specimen: Nasopharyngeal Swab  Result Value Ref Range Status   SARS Coronavirus 2 NEGATIVE NEGATIVE Final    Comment: (NOTE) SARS-CoV-2 target nucleic acids are NOT DETECTED.  The SARS-CoV-2 RNA is generally detectable in upper and lower respiratory specimens during the acute phase of infection. Negative results do not preclude  SARS-CoV-2 infection, do not rule out co-infections with other pathogens, and should not be used as the sole basis for treatment or other patient management decisions. Negative results must be combined with clinical observations, patient history, and epidemiological information. The expected result is Negative.  Fact Sheet for Patients: SugarRoll.be  Fact Sheet for Healthcare Providers: https://www.woods-mathews.com/  This test is not yet approved or cleared by the [place] FDA and  has been authorized for detection and/or diagnosis of SARS-CoV-2 by FDA under an Emergency Use Authorization (EUA). This EUA will remain  in effect (meaning this test can be used) for the duration of the COVID-19 declaration under Se ction 564(b)(1) of the Act, 21 U.S.C. section 360bbb-3(b)(1), unless the authorization is terminated or revoked sooner.  Performed at [facility], [place] [address], [place], [place] [address]      Time coordinating discharge: 32 minutes  SIGNED:   Hosie Poisson, MD  [facility] 04/14/2021, 11:15 AM

## 2021-04-14 NOTE — TOC Transition Note (Signed)
Transition of Care [facility]) - CM/SW Discharge Note   Patient Details  Name: Doris Gray MRN: [id number] Date of Birth: Dec 07, 1930  Transition of Care [facility] CM/SW Contact:  Trish Mage, LCSW Phone Number: 04/14/2021, 11:12 AM   Clinical Narrative:  Patient who is stable for discharge is being transported by daughter back to [facility].  D/C summary and FL2 faxed to facility per request. Referral made to [facility] for outpatient palliative follow up. Daughter confirms that she has lined up additional support for patient through [facility] who came to assess patient this AM. No further needs identified.  TOC sign off.     Final next level of care: Assisted Living Barriers to Discharge: Barriers Resolved   Patient Goals and CMS Choice        Discharge Placement                       Discharge Plan and Services                                     Social Determinants of Health (SDOH) Interventions     Readmission Risk Interventions No flowsheet data found.

## 2021-04-15 ENCOUNTER — Telehealth: Payer: Self-pay

## 2021-04-15 ENCOUNTER — Encounter: Payer: Self-pay | Admitting: Adult Health

## 2021-04-15 DIAGNOSIS — Z515 Encounter for palliative care: Secondary | ICD-10-CM

## 2021-04-15 NOTE — Telephone Encounter (Signed)
Per daughter [name]  Transition Care Management Follow-up Telephone Call Date of discharge and from where: 04/14/2021 [facility] How have you been since you were released from the hospital? Very confused Any questions or concerns? No  Items Reviewed: Did the pt receive and understand the discharge instructions provided? Yes  Medications obtained and verified? Yes  Other? No  Any new allergies since your discharge? No  Dietary orders reviewed? Yes Do you have support at home? Yes   Home Care and Equipment/Supplies: Were home health services ordered? yes If so, what is the name of the agency?   Has the agency set up a time to come to the patient's home? no Were any new equipment or medical supplies ordered?  No What is the name of the medical supply agency? N/a Were you able to get the supplies/equipment? not applicable Do you have any questions related to the use of the equipment or supplies? No  Functional Questionnaire: (I = Independent and D = Dependent) ADLs: D  Bathing/Dressing- D  Meal Prep- D  Eating- I  Maintaining continence- I  Transferring/Ambulation- I, supervision  Managing Meds- D  Follow up appointments reviewed:  PCP Hospital f/u appt confirmed? Yes  Scheduled to see Tommi Rumps on 04/17/2021 @ 11:30. Are transportation arrangements needed? No  If their condition worsens, is the pt aware to call PCP or go to the Emergency Dept.? Yes Was the patient provided with contact information for the PCP's office or ED? Yes Was to pt encouraged to call back with questions or concerns? Yes

## 2021-04-15 NOTE — Telephone Encounter (Signed)
(  3:29 pm) SW left a message for patient's daughter [name] requesting a call back to schedule a visit.

## 2021-04-16 ENCOUNTER — Encounter: Payer: Self-pay | Admitting: Adult Health

## 2021-04-17 ENCOUNTER — Encounter: Payer: Self-pay | Admitting: Adult Health

## 2021-04-17 ENCOUNTER — Other Ambulatory Visit (HOSPITAL_BASED_OUTPATIENT_CLINIC_OR_DEPARTMENT_OTHER): Payer: Self-pay

## 2021-04-17 ENCOUNTER — Ambulatory Visit (INDEPENDENT_AMBULATORY_CARE_PROVIDER_SITE_OTHER): Payer: Medicare Other | Admitting: Adult Health

## 2021-04-17 VITALS — BP 130/80 | HR 86 | Temp 97.0°F | Ht 63.0 in

## 2021-04-17 DIAGNOSIS — E538 Deficiency of other specified B group vitamins: Secondary | ICD-10-CM

## 2021-04-17 DIAGNOSIS — G9340 Encephalopathy, unspecified: Secondary | ICD-10-CM | POA: Diagnosis not present

## 2021-04-17 DIAGNOSIS — N3000 Acute cystitis without hematuria: Secondary | ICD-10-CM

## 2021-04-17 DIAGNOSIS — I10 Essential (primary) hypertension: Secondary | ICD-10-CM | POA: Diagnosis not present

## 2021-04-17 DIAGNOSIS — J101 Influenza due to other identified influenza virus with other respiratory manifestations: Secondary | ICD-10-CM

## 2021-04-17 DIAGNOSIS — E890 Postprocedural hypothyroidism: Secondary | ICD-10-CM

## 2021-04-17 NOTE — Progress Notes (Signed)
Subjective:    Patient ID: Doris Gray, female    DOB: 07/19/1930, 87 y.o.   MRN: [id number]  HPI 87 year old female who  has a past medical history of CAD (coronary artery disease), Chicken pox, Colon polyps, Emphysema of lung ([place]), History of thyroid surgery, Hypertension, Hypothyroidism, Melanoma ([place]), Nodule of right lung, and Splenic artery aneurysm ([name]).  She presents to the office today with her daughter for TCM visit   Admit Date 04/07/2021 Discharge Date 04/14/2021  She was brought to the emergency room for fever and confusion.  Her work-up revealed a urinary tract infection and influenza A.  Thankfully her chest x-ray was unremarkable.  Hospital Course  Acute metabolic encephalopathy secondary to UTI and influenza A in the setting of hyperthyroidism and vitamin B 12 deficiency. Cultures grew E. coli resistance to Rocephin and ampicillin.  E. coli urinary tract infection-single dose of fosfomycin was given. Lab Results  Component Value Date   VITAMINB12 165 (L) 04/09/2021     Influenza A infection She completed a course of Tamiflu.  She had wheezing with rhonchi on exam.  Started her on IV steroids with improvement in breathing and wheezing.  She was transitioned to oral prednisone for another 2 days.  No wheezing heard on discharge.    Agitated delirium and behavioral disturbance  Related to acute illness with baseline dementia.  She was started on gabapentin 100 mg twice daily and trazodone 25 QHS.  This was discontinued in the last 24 hours and his family felt that she was not back to her baseline.  Hypertension -Blood pressure parameters are within normal range  Hypothyroidism TSH elevated, free T4 elevated Decrease dose of Synthroid to 125 mcg daily Recommended 3 days of 137 mcg and 4 days of 125 mcg weekly.  Elevated Liver Enzymes -Ultrasound showed fatty liver.  Her liver enzymes were improving upon discharge.  Her acute hepatitis panel is negative.   Recommended outpatient follow-up with liver panel in 4 weeks  Today her daughter reports that patient is slowly returning to near baseline but she is unsure if she will ever return to where she was prior to hospitalization.  Her daughter has arranged 24-hour care for the next 14 days at her assisted living facility.  They have also been in touch with palliative care and will continue with this.  She has not had any fevers, chills, or UTI symptoms since being discharged.  Patient has no recollection of being in the hospital    Review of Systems  Unable to perform ROS: Dementia  Past Medical History:  Diagnosis Date   CAD (coronary artery disease)    Chicken pox    Colon polyps    Emphysema of lung ([name])    History of thyroid surgery    Hypertension    Hypothyroidism    Melanoma ([name])    Nodule of right lung    calcified granuloma 2.6 mm on CT from 2017    Splenic artery aneurysm ([name])    2.6 cm     Social History   Socioeconomic History   Marital status: Widowed    Spouse name: Not on file   Number of children: Not on file   Years of education: Not on file   Highest education level: Bachelor's degree (e.g., BA, AB, BS)  Occupational History   Occupation: Retired  Tobacco Use   Smoking status: Former   Smokeless tobacco: Never  Scientific laboratory technician Use: Never used  Substance  and Sexual Activity   Alcohol use: Not Currently    Comment: Glass of wine occasionally   Drug use: Never   Sexual activity: Not on file  Other Topics Concern   Not on file  Social History Narrative   Not on file   Social Determinants of Health   Financial Resource Strain: Not on file  Food Insecurity: Not on file  Transportation Needs: Not on file  Physical Activity: Not on file  Stress: Not on file  Social Connections: Not on file  Intimate Partner Violence: Not on file    Past Surgical History:  Procedure Laterality Date   OTHER SURGICAL HISTORY     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 237 mL 12   fexofenadine (ALLEGRA) 180 MG tablet Take 180 mg by mouth daily.     fluticasone (FLONASE) 50 MCG/ACT nasal spray Place 1 spray into both nostrils 2 (two) times daily.     folic acid (FOLVITE) 1 MG tablet Take 1 tablet (1 mg total) by mouth daily. 30 tablet 1   gabapentin (NEURONTIN) 300 MG capsule Take 1 capsule (300 mg total) by mouth 2 (two) times daily. 60 capsule 2   guaiFENesin (MUCINEX) 600 MG 12 hr tablet Take 600 mg by mouth 2 (two) times daily.     levothyroxine (SYNTHROID) 125 MCG tablet Take 1 tablet (125 mcg total) by mouth every Tuesday, Thursday, Saturday, and Sunday. 90 tablet 2   levothyroxine (SYNTHROID) 137 MCG tablet Take 1 tablet (137 mcg total) by mouth every Monday, Wednesday, and Friday. 90 tablet 3   losartan (COZAAR) 50 MG tablet Take 1 tablet (50 mg total) by mouth daily. 90 tablet 3   Multiple Vitamins-Minerals (CEROVITE SENIOR) TABS Take 1 tablet by mouth daily. 60 tablet 0   traZODone (DESYREL) 50 MG tablet Take 0.5 tablets (25 mg total) by mouth at bedtime. 20 tablet 0   No current facility-administered medications on file prior to visit.    BP 130/80   Pulse 86   Temp (!) 97 F (36.1 C) (Temporal)   Ht 5\' 3"  (1.6 m)   SpO2 94%   BMI 35.43 kg/m       Objective:   Physical Exam Vitals and nursing note reviewed.  Constitutional:      Appearance: Normal  appearance.  Cardiovascular:     Rate and Rhythm: Normal rate and regular rhythm.     Pulses: Normal pulses.     Heart sounds: Normal heart sounds.  Pulmonary:     Effort: Pulmonary effort is normal.     Breath sounds: Normal breath sounds.  Musculoskeletal:        General: Normal range of motion.  Skin:    General: Skin is warm and dry.     Capillary  Refill: Capillary refill takes less than 2 seconds.  Neurological:     Mental Status: She is alert. Mental status is at baseline. She is disoriented.  Psychiatric:        Mood and Affect: Mood and affect normal.        Thought Content: Thought content normal.        Cognition and Memory: Cognition is impaired. Memory is impaired. She exhibits impaired recent memory and impaired remote memory.      Assessment & Plan:  1. Acute cystitis without hematuria -Reviewed hospital discharge notes, imaging, labs, and follow-up instructions.  All questions answered to the best of my ability. - TSH; Future - CBC with Differential/Platelet; Future - Basic Metabolic Panel; Future  2. Acute encephalopathy - Resolving  - continue with 24 hours care  - TSH; Future - CBC with Differential/Platelet; Future - Basic Metabolic Panel; Future  3. Primary hypertension - No change - TSH; Future - CBC with Differential/Platelet; Future - Basic Metabolic Panel; Future  4. Influenza A - Resolved  5. Postablative hypothyroidism - Will recheck TSH in 4 weeks d/t change in dose of synthroid - TSH; Future - CBC with Differential/Platelet; Future - Basic Metabolic Panel; Future  6. B12 deficiency  - Vitamin B12; Future   Dorothyann Peng, NP

## 2021-04-17 NOTE — Patient Instructions (Signed)
It was great seeing you today   Please follow up in 3-4 weeks for blood work   I am going to stop Flonase as you do not need this medication

## 2021-04-20 ENCOUNTER — Telehealth: Payer: Self-pay

## 2021-04-20 DIAGNOSIS — Z515 Encounter for palliative care: Secondary | ICD-10-CM

## 2021-04-20 NOTE — Telephone Encounter (Signed)
(  3:55 pm) SW scheduled initial palliative care visit with patient's daughter. RN/SW is scheduled  for 04/28/21 @ 1 pm.

## 2021-04-28 ENCOUNTER — Other Ambulatory Visit: Payer: Self-pay

## 2021-04-28 ENCOUNTER — Non-Acute Institutional Stay: Payer: Medicare Other | Admitting: *Deleted

## 2021-04-28 ENCOUNTER — Non-Acute Institutional Stay: Payer: Medicare Other

## 2021-04-28 ENCOUNTER — Other Ambulatory Visit: Payer: Medicare Other | Admitting: Hospice

## 2021-04-28 DIAGNOSIS — Z515 Encounter for palliative care: Secondary | ICD-10-CM

## 2021-04-28 NOTE — Progress Notes (Signed)
° ° °  [facility] Palliative Care Consult Note Telephone: [phone]  Fax: [phone]  PATIENT NAME: Doris Gray DOB: [birth year] MRN: [id number]  PRIMARY CARE PROVIDER:   Dorothyann Peng, NP Dorothyann Peng, NP [place],  [place] [address]  REFERRING PROVIDER: Dorothyann Peng, NP Dorothyann Peng, NP [place],  [place] [address]  RESPONSIBLE PARTY:  Veneda Melter Information     Name Relation Home Work [place] Daughter   [phone]   Raoul Pitch [phone]         TELEHEALTH VISIT STATEMENT Due to the COVID-19 crisis, this visit was done via telemedicine from my office and it was initiated and consent by this patient and or family. Video-audio (telehealth) contact was unable to be done due to technical barriers from the patients side. I connected with patient OR PROXY by a telephone  and verified that I am speaking with the correct person. I discussed the limitations of evaluation and management by telemedicine. The patient expressed understanding and agreed to proceed.   RECOMMENDATIONS/PLAN:      Advance care planning discussion at the request of Katheren Puller SW   Advance Care Planning: Our advance care planning conversation included a discussion about:    The value and importance of advance care planning  Exploration of goals of care in the event of a sudden injury or illness  Identification and preparation of a healthcare agent  Review and updating or creation of an  advance directive document   CODE STATUS: Ramifications and implications of CODE STATUS discussed.  Patient elected to be a DO NOT RESUSCITATE. Brayton Layman will give signed DNR to patient and upload same in [place]. GOALS OF CARE: Goals of care include to maximize quality of life and symptom management.     I spent 16 minutes providing this palliative consultation; time includes time spent with patient/family, chart  review, provider coordination,  and documentation. More than 50% of the time in this consultation was spent on coordinating communication   CHIEF COMPLAIN: Telehealth visit for goals of care and advance care planning.   Thank you for the opportunity to participate in the care of Ariauna Farabee Please call our office at [phone] if we can be of additional assistance.  Note: Portions of this note were generated with L[employer]. Dictation errors may occur despite best attempts at proofreading.  Teodoro Spray, NP
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
## 2021-04-29 NOTE — Progress Notes (Signed)
[facility]ARE SW NOTE  PATIENT NAME: Doris Gray DOB: 06-Oct-1930 MRN: [id number]  PRIMARY CARE PROVIDER: Dorothyann Peng, NP  RESPONSIBLE PARTY:  Acct ID - Guarantor Home Phone Work Phone Relationship Acct Type  [id number] TATIANA, [phone]  Self P/F     [place], [place], [place] [address]     PLAN OF CARE and INTERVENTIONS:             GOALS OF CARE/ ADVANCE CARE PLANNING:  Goal is for patient to get back to her baseline and avoid hospitalizations. Patient is a DNR, form completed today and left in the home.  SOCIAL/EMOTIONAL/SPIRITUAL ASSESSMENT/ INTERVENTIONS:  SW and RN-M. Nadara Mustard completed initial palliative care visit with patient and her daughter-Terry at her AL apartment at [place]. Patient and her daughter was provided education to patient and daughter regarding palliative care services. Patient's daughter provided verbal and written consent to services. Patient was observed sitting at her dinning room table, eating lunch. Patient was alert and oriented to self and place, but memory deficits were present at she repeated herself, could not recall events or find the correct words where she looked to her daughter for assistance. Patient repeatedly stated "I'm doing aye okay". Patient returned to the facility following a recent hospitalization due to a UTI. She had 24/7 caregivers through [facility] for 2 weeks until patient was strong enough to walk again.However, the sitters agitated patient more where she wanted them to leave because she did not understand why they were there.  Currently patient is more confused since her hospitalization. She ambulates independently. She requires assistance with dressing, bathing and ADL's. Her daughter advised that patient would not do personal care tasks with out being prompted. Patient does display agitation with showers, as well as when she has her vitals taken, which the daughter declined to have done today.  Patient appetite is fair, with breakfast being her best meal. Patient does have intermittent  urinary incontinence and wears pads for additional protection. Patient's son serve as her POA/HCPOA, but her daughter is her PCG. Patient expressed goals that patient not return to the hospital and she requests that patient be a DNR as expressed in her living will. DNR completed via telehealth with NP-L. [place]. DNR form left in the home. Patient and daughter are open to ongoing palliative care visits/support. Patient to be seen in 1 month.  PATIENT/CAREGIVER EDUCATION/ COPING:  Patient is pleasantly confused, but appears to be coping well. Patient's daughter expressed understanding of the education provided regarding palliative care. The daughter is open and expressed relief to have palliative care support.  PERSONAL EMERGENCY PLAN:  Per facility protocol.  COMMUNITY RESOURCES COORDINATION/ HEALTH CARE NAVIGATION:  Patient has a private caregiver to assist with showers 1-2x/week.  FINANCIAL/LEGAL CONCERNS/INTERVENTIONS:  None.     SOCIAL HX:  Social History   Tobacco Use   Smoking status: Former   Smokeless tobacco: Never  Substance Use Topics   Alcohol use: Not Currently    Comment: Glass of wine occasionally    CODE STATUS: DNR ADVANCED DIRECTIVES: Yes MOST FORM COMPLETE:  No HOSPICE EDUCATION PROVIDED: No  PPS: Patient is pleasantly confused. She ambulates independently. She requires assistance with ADL's.   Duration of visit and documentation: 60 minutes.   [address] [place], [place]

## 2021-05-06 ENCOUNTER — Encounter: Payer: Self-pay | Admitting: Adult Health

## 2021-05-07 ENCOUNTER — Encounter: Payer: Self-pay | Admitting: Adult Health

## 2021-05-07 NOTE — Telephone Encounter (Signed)
Called left message on daughters cell. Per previous my chart we had sent needed clarification on what was to be faxed. Asked that she respond to that message or give [place] a call to we can get this taken care of.

## 2021-05-08 NOTE — Addendum Note (Signed)
Addended by: Gwenyth Ober R on: 05/08/2021 09:38 AM   Modules accepted: Orders

## 2021-06-01 ENCOUNTER — Other Ambulatory Visit: Payer: Self-pay

## 2021-06-01 ENCOUNTER — Non-Acute Institutional Stay: Payer: Medicare Other

## 2021-06-01 DIAGNOSIS — Z515 Encounter for palliative care: Secondary | ICD-10-CM

## 2021-06-01 NOTE — Progress Notes (Signed)
COMMUNITY PALLIATIVE CARE SW NOTE  PATIENT NAME: Doris Gray DOB: 03/30/1931 MRN: [id number]  PRIMARY CARE PROVIDER: Dorothyann Peng, NP  RESPONSIBLE PARTY:  Acct ID - Guarantor Home Phone Work Phone Relationship Acct Type  [id number] LATORYA, [phone]  Self P/F     [place], [place], [place] [address]   Due to the COVID-19 crisis, this virtual check-in visit was done via telephone from my office and it was initiated and consent by this patient and or family.  SOCIAL WORK  TELEPHONIC ENCOUNTER (11:28 am) PC SW completed a telephonic check-in encounter with patient's daughter-Doris Gray, who provided a  status update on patient. The daughter advised that patient remains stable, but the staff is having a hard time getting patient to take her medication. The staff is having to go back to patient several times in order to get her to take her medications. Patient continues to eat well. She continues to ambulate independently. She continues to have intermittent confusion and forgetfulness. Patient had the flu early December, but recovered well. Her daughter plans to take patient to have her flu and COVID booster soon. Patient's daughter and a private caregiver provide personal care for patient at least 3 x week. SW explained to daughter that patient's palliative team has changed and that patient will be covered by NP-Doris Gray and RN-Doris Gray, who will call and schedule a visit with her soon.  No other concerns were noted.   [address] [place], [place]

## 2021-06-02 ENCOUNTER — Telehealth: Payer: Self-pay

## 2021-06-02 NOTE — Telephone Encounter (Signed)
VM left for patient's daughter re: NP visit for patient.

## 2021-06-09 ENCOUNTER — Other Ambulatory Visit: Payer: Self-pay

## 2021-06-09 ENCOUNTER — Non-Acute Institutional Stay: Payer: Medicare Other | Admitting: Family Medicine

## 2021-06-09 VITALS — BP 130/82 | HR 92 | Temp 97.9°F

## 2021-06-09 DIAGNOSIS — I728 Aneurysm of other specified arteries: Secondary | ICD-10-CM

## 2021-06-09 DIAGNOSIS — E890 Postprocedural hypothyroidism: Secondary | ICD-10-CM

## 2021-06-09 DIAGNOSIS — E538 Deficiency of other specified B group vitamins: Secondary | ICD-10-CM

## 2021-06-09 DIAGNOSIS — F03B18 Unspecified dementia, moderate, with other behavioral disturbance: Secondary | ICD-10-CM

## 2021-06-09 DIAGNOSIS — K760 Fatty (change of) liver, not elsewhere classified: Secondary | ICD-10-CM

## 2021-06-09 DIAGNOSIS — Z515 Encounter for palliative care: Secondary | ICD-10-CM

## 2021-06-09 NOTE — Progress Notes (Signed)
[facility] Palliative Care Consult Note Telephone: [phone]  Fax: [phone]    Date of encounter: 06/09/21 9:14 AM PATIENT NAME: Doris Gray [address] [place] [address]   [phone] (home)  DOB: 08/01/1930 MRN: [id number] PRIMARY CARE PROVIDER:    Dorothyann Peng, NP,  [place] [address] [phone]  REFERRING PROVIDER:   Dorothyann Peng, NP [address],  [place] [address] [phone]  RESPONSIBLE PARTY:    Contact Information     Name Relation Home Work [place] Daughter   [phone]   Doris Gray [phone]        I met face to face with patient and daughter, Doris Gray in her Assisted Living facility. Palliative Care was asked to follow this patient by consultation request of  Doris Gray, Doris Rumps, NP to address advance care planning and complex medical decision making. This is a follow up visit.                                   ASSESSMENT, SYMPTOM MANAGEMENT AND PLAN / RECOMMENDATIONS:   Palliative Care Encounter-Reviewed MOST options with pt's daughter Doris Gray and pt.  Pt had difficulty following discussion and kept stating she was fine and wasn't having any problems breathing, etc.  Needs forms created for chart at facility/EMS.  Provided education on normal trajectory of dementia, that meds could be crushed and added to foods, split pills different times of day.  Moderate dementia, unspecified type with behavioral disturbance-Value routine, avoid openly challenging pt. Attempt redirection.  Avoid ACH receptor agonists which may make symptoms worse. If attempt to de-escalate meds would recommend d/c Allegra and multivitamins.    Post-ablative hypothyroidism-Continue alternating Levothyroxine 137 mcg with 125 mcg every other day.  Repeat TSH scheduled per PCP.  4.   Non-alcoholic fatty liver, noted on [place].  Avoid hepatotoxic substances or intake          Of more than 3 gm Tylenol/24 hours.  Repeat LFTs scheduled per PCP.  5.  Vitamin B12 deficiency-Given dementia, very low Vitamin B12 level with risk for       Permanent neuropathy and risks for non-compliance, would recommend repeat      B12 level since other labs being drawn and monthly Vitamin B12 injections       1000 mcg injections  Advance Care Planning/Goals of Care: Goals include to maximize quality of life and symptom management. Health care surrogate gave her permission to discuss. Our advance care planning conversation included a discussion about:    The value and importance of advance care planning-does have a advanced directive on file Experiences with loved ones who have been seriously ill or have died  Exploration of personal, cultural or spiritual beliefs that might influence medical decisions  Exploration of goals of care in the event of a sudden injury or illness  Identification of a healthcare agent-daughter Doris Gray is Doris Gray POA Review of an advance directive document-wishes to review MOST form and possibly complete with pt's son-in-law present. Decision not to resuscitate or to de-escalate disease focused treatments due to poor prognosis and difficulty with tolerating aggressive care or inpatient environment.  Indicates she does not want pt intubated.  CODE STATUS: DNR    Follow up Palliative Care Visit: Palliative care will continue to follow for complex medical decision making, advance care planning, and clarification of goals. Return 4  weeks or prn.    This visit was coded based on medical decision making (MDM).  PPS: 50%  HOSPICE ELIGIBILITY/DIAGNOSIS: TBD  Chief Complaint:  Manufacturing engineer Palliative Care follow sup with patient for chronic disease management, advance directive and defining/refining goals of care.   HISTORY OF PRESENT ILLNESS:  Doris Gray is a 88 y.o. year old female  living in ALF. Daughter states hospitalization at end of  November through early December 2022 with UTI and influenza A.  She describes hospitalization as "horrible" stating that pt was resistive, confused and had to be restrained to prevent harm.  Pt does have a CT head which showed chronic microvascular disease and prominent sulci and ventricles consistent with atrophy.  She is noted to relate most conversation to her tonsillitis as a child and repetitively stated that she was "Doing well".  Denies SOB, falls, pain. Able to do own things, however daughter indicates that pt has to have help for dressing and bathing.  When asking about her mood she states "I miss my husband".  Daughter states husband died in 07-06-2015. Pt has a history of sleep apnea but has elected not to treat it for several years.  She was noted during hospitalization to elevated LFTs and tested negative for Hepatitis A, B and C.  Abdominal US showed fatty liver in 03/2021.  On 04/13/21 ALT 116 and AST 90.  She was noted to be severely deficient in vitamin B12 at 165 so she was started on Certavite senior vitamin and folic acid, containing Vitamin B12.  Daughter states hospitalist recommended injections and her PCP indicated that there was solid evidence to suggest oral formulations were well absorbed.  She states the patient has not gotten back to her baseline function and when first returned to ALF she was resistive to taking medications giving the number of her meds as the problem and demanding to know what she was taking and whether or not the med tech giving them to her was on them, asking why she should take them.   Her TSH and free T3 were both low at 0.137 and 1.9 respectively while Free T4 was elevated at 1.71.  At the time she was on Levothyroxine 137 mcg daily.  Her dose was decreased so she is alternating every other day with Levothyroxine 125 mcg daily.  Daughter wants to avoid re-hospitalization at all costs, wants pt to be comfortable and maintained in her facility.  Pt has upcoming appt with  PCP to redraw TSH and LFTs.  She has been working with PT and appetite is good.  Pt tended to repeat the same story multiple times, mostly having to do with her tonsillitis from childhood. Daughter states she had 24/7 caregivers after d/c from [place] but pt did not want them around.  History obtained from review of EMR, discussion with primary team, and interview with daughter Doris Gray and Ms. Gladwin.  I reviewed available labs, medications, imaging, studies and related documents from the EMR.  Records reviewed and summarized above.   ROS General: NAD EYES: denies vision changes ENMT: denies dysphagia Cardiovascular: denies chest pain, denies DOE Pulmonary: denies cough, denies increased SOB Abdomen: endorses good appetite, denies constipation, endorses continence of bowel GU: denies dysuria, daughter endorses occasional incontinence of urine MSK:  denies increased weakness,  no recent falls reported Skin: denies rashes or wounds Neurological: denies pain, denies insomnia Psych: Endorses positive mood, occasionally oppositional Heme/lymph/immuno: denies bruises, abnormal bleeding  Physical Exam: Current and past weights: weight  186 lb (84.4 kg) as of 11/18/20, weight 04/07/21 200 lb (90.7 kg) Constitutional: NAD General: WN, WD, obese female EYES: anicteric sclera, lids intact, no discharge  ENMT: intact hearing, oral mucous membranes moist, dentition intact CV: S1S2, RRR, no LE edema Pulmonary: LCTA, no increased work of breathing, no cough, room air Abdomen: intake 100%, normo-active BS + 4 quadrants, soft and non tender, no ascites GU: deferred MSK: no sarcopenia, moves all extremities, ambulatory with mildly unsteady gait Skin: warm and dry, no rashes or wounds on visible skin Neuro:  no generalized weakness,  noted cognitive impairment for short term memory and some paranoia Psych: non-anxious affect, A and O to self and place Hem/lymph/immuno: no widespread  bruising   Thank you for the opportunity to participate in the care of Ms. Hansman.  The palliative care team will continue to follow. Please call our office at [phone] if we can be of additional assistance.   Marijo Conception, FNP   COVID-19 PATIENT SCREENING TOOL Asked and negative response unless otherwise noted:   Have you had symptoms of covid, tested positive or been in contact with someone with symptoms/positive test in the past 5-10 days?  no

## 2021-06-10 ENCOUNTER — Encounter: Payer: Self-pay | Admitting: Family Medicine

## 2021-06-10 ENCOUNTER — Telehealth: Payer: Self-pay | Admitting: Adult Health

## 2021-06-10 DIAGNOSIS — F03B0 Unspecified dementia, moderate, without behavioral disturbance, psychotic disturbance, mood disturbance, and anxiety: Secondary | ICD-10-CM | POA: Insufficient documentation

## 2021-06-10 DIAGNOSIS — K760 Fatty (change of) liver, not elsewhere classified: Secondary | ICD-10-CM | POA: Insufficient documentation

## 2021-06-10 DIAGNOSIS — Z515 Encounter for palliative care: Secondary | ICD-10-CM | POA: Insufficient documentation

## 2021-06-10 DIAGNOSIS — E538 Deficiency of other specified B group vitamins: Secondary | ICD-10-CM | POA: Insufficient documentation

## 2021-06-10 NOTE — Telephone Encounter (Signed)
Nursing Home Orders to be filled out--placed in dr's folder.  Fax to: [phone], Attn: Burna Mortimer DeVaughn

## 2021-06-10 NOTE — Telephone Encounter (Signed)
Noted! Doris Gray

## 2021-06-11 NOTE — Telephone Encounter (Signed)
Forms filled out and faxed with confirmation.

## 2021-07-06 ENCOUNTER — Encounter: Payer: Self-pay | Admitting: Adult Health

## 2021-07-07 NOTE — Telephone Encounter (Signed)
Please advise. Orders are pending but want to verify

## 2021-07-24 ENCOUNTER — Other Ambulatory Visit (INDEPENDENT_AMBULATORY_CARE_PROVIDER_SITE_OTHER): Payer: Medicare Other

## 2021-07-24 DIAGNOSIS — G9340 Encephalopathy, unspecified: Secondary | ICD-10-CM

## 2021-07-24 DIAGNOSIS — N3000 Acute cystitis without hematuria: Secondary | ICD-10-CM

## 2021-07-24 DIAGNOSIS — E538 Deficiency of other specified B group vitamins: Secondary | ICD-10-CM | POA: Diagnosis not present

## 2021-07-24 DIAGNOSIS — I10 Essential (primary) hypertension: Secondary | ICD-10-CM

## 2021-07-24 DIAGNOSIS — E890 Postprocedural hypothyroidism: Secondary | ICD-10-CM | POA: Diagnosis not present

## 2021-07-24 LAB — CBC WITH DIFFERENTIAL/PLATELET
Basophils Absolute: 0 10*3/uL (ref 0.0–0.1)
Basophils Relative: 0.4 % (ref 0.0–3.0)
Eosinophils Absolute: 0.4 10*3/uL (ref 0.0–0.7)
Eosinophils Relative: 3.9 % (ref 0.0–5.0)
HCT: 41.5 % (ref 36.0–46.0)
Hemoglobin: 14.1 g/dL (ref 12.0–15.0)
Lymphocytes Relative: 23.8 % (ref 12.0–46.0)
Lymphs Abs: 2.2 10*3/uL (ref 0.7–4.0)
MCHC: 34 g/dL (ref 30.0–36.0)
MCV: 90.1 fl (ref 78.0–100.0)
Monocytes Absolute: 0.7 10*3/uL (ref 0.1–1.0)
Monocytes Relative: 7.7 % (ref 3.0–12.0)
Neutro Abs: 5.8 10*3/uL (ref 1.4–7.7)
Neutrophils Relative %: 64.2 % (ref 43.0–77.0)
Platelets: 237 10*3/uL (ref 150.0–400.0)
RBC: 4.6 Mil/uL (ref 3.87–5.11)
RDW: 13.9 % (ref 11.5–15.5)
WBC: 9.1 10*3/uL (ref 4.0–10.5)

## 2021-07-24 LAB — BASIC METABOLIC PANEL
BUN: 16 mg/dL (ref 6–23)
CO2: 29 mEq/L (ref 19–32)
Calcium: 10.9 mg/dL — ABNORMAL HIGH (ref 8.4–10.5)
Chloride: 103 mEq/L (ref 96–112)
Creatinine, Ser: 0.97 mg/dL (ref 0.40–1.20)
GFR: 52.28 mL/min — ABNORMAL LOW (ref >60.00)
Glucose, Bld: 116 mg/dL — ABNORMAL HIGH (ref 70–99)
Potassium: 3.8 mEq/L (ref 3.5–5.1)
Sodium: 141 mEq/L (ref 135–145)

## 2021-07-24 LAB — TSH: TSH: 7.03 u[IU]/mL — ABNORMAL HIGH (ref 0.35–5.50)

## 2021-07-24 LAB — VITAMIN B12: Vitamin B-12: 356 pg/mL (ref 211–911)

## 2021-08-13 ENCOUNTER — Encounter: Payer: Self-pay | Admitting: Adult Health

## 2021-08-28 ENCOUNTER — Telehealth: Payer: Self-pay | Admitting: *Deleted

## 2021-08-28 NOTE — Telephone Encounter (Signed)
Received a communication from patient's daughter, [name], requesting NP visit next week while her brother is in town so they can all discuss and fill out MOST form. Visit scheduled with Damaris Hippo NP on 09/02/21'@1p'$ .  ?

## 2021-09-02 ENCOUNTER — Encounter: Payer: Self-pay | Admitting: Family Medicine

## 2021-09-02 ENCOUNTER — Non-Acute Institutional Stay: Payer: Medicare Other | Admitting: Family Medicine

## 2021-09-02 VITALS — BP 130/84 | HR 78 | Resp 16

## 2021-09-02 DIAGNOSIS — Z515 Encounter for palliative care: Secondary | ICD-10-CM

## 2021-09-02 DIAGNOSIS — M2550 Pain in unspecified joint: Secondary | ICD-10-CM

## 2021-09-02 DIAGNOSIS — R350 Frequency of micturition: Secondary | ICD-10-CM

## 2021-09-02 NOTE — Progress Notes (Signed)
? ? ?Manufacturing engineer ?Community Palliative Care Consult Note ?Telephone: [phone]  ?Fax: [phone]  ? ? ?Date of encounter: 09/02/21 ?1:30 PM ?PATIENT NAME: Doris Gray ?[address] ?[place] [address]   ?[phone] (home)  ?DOB: 1931-01-22 ?MRN: [id number] ?PRIMARY CARE PROVIDER:    ?Dorothyann Peng, NP,  ?[address] ?[place] [address] ?[phone] ? ?REFERRING PROVIDER:   ?Dorothyann Peng, NP ?[address] ?[place],  [place] [address] ?[phone] ? ?RESPONSIBLE PARTY:    ?Contact Information   ? ? Name Relation Home Work [employer]  ? Doris Gray Daughter   [phone]  ? Doris Gray Other [phone]    ? ?  ? ? ? ?I met face to face with patient and family in her assisted living facility. Palliative Care was asked to follow this patient by consultation request of  Nafziger, Tommi Rumps, NP to address advance care planning and complex medical decision making. This is a follow up visit. ? ?                                 ASSESSMENT, SYMPTOM MANAGEMENT AND PLAN / RECOMMENDATIONS:  ? Palliative Care Encounter ?Discussed goals of care and created MOST per family stated wishes. ? ?2.  Urinary frequency ?U/a with reflex to culture ? ?3.  Arthralgia of multiple sites ?Scheduled Tylenol arthritis 500 mg TID. ? ? ? ?Advance Care Planning/Goals of Care: Goals include to maximize quality of life and symptom management. Patient and health care surrogate gave their permission to discuss. ?Our advance care planning conversation included a discussion about:    ?The value and importance of advance care planning-son is [facility] POA ?Experiences with loved ones who have been seriously ill or have died  ?Exploration of personal, cultural or spiritual beliefs that might influence medical -pt with strong belief in God ?Exploration of goals of care in the event of a sudden injury or illness-DNR ?Identification of a healthcare agent-son Doris Gray ?Review and creation of an  advance  directive document-MOST. ?Decision not to resuscitate or to de-escalate disease focused treatments due to poor prognosis. ?CODE STATUS: ?MOST as of 09/02/21: ?DNR/DNI, comfort measures ?IV fluids, antibiotics and feeding tube on case by case time limited trial ? ? ? ?Follow up Palliative Care Visit: Palliative care will continue to follow for complex medical decision making, advance care planning, and clarification of goals. Return 4 weeks or prn. ? ? ?This visit was coded based on medical decision making (MDM). ? ?PPS: 50% ? ?HOSPICE ELIGIBILITY/DIAGNOSIS: TBD ? ?Chief Complaint:  ?Palliative Care was requested for acute visit as pt has had some recent urinary frequency, migratory joint pains/myalgias and so her goals of care could be discussed with family/established. ? ? ?HISTORY OF PRESENT ILLNESS:  Doris Gray is a 88 y.o. year old female with dementia, splenic artery aneurysm, HTN, NAFLD, hypothyroidism, melanoma and vitamin B12 deficiency .  Pt denies fever, nausea, vomiting, dysuria, CP, SOB, lightheadedness, falls, constipation. Pt states "No, I am doing fine thank God! I have no problems."  Son, Doris Gray who is [facility] POA is visiting from [place]. Family has requested a visit to address MOST form/goals of care.  Daughter states pt has been exhibiting significant urinary frequency and in a 1 hour visit voided 3 times day before last and in a short visit to her home, pt voided 6 times in a couple of hours. ? ?History obtained from review of EMR, discussion  with primary team, and interview with family, facility staff/caregiver and/or Ms. Mcphillips.  ?I reviewed available labs, medications, imaging, studies and related documents from the EMR.  Records reviewed and summarized above.  ? ?ROS ?General: NAD ?EYES: denies vision changes ?ENMT: denies dysphagia ?Cardiovascular: denies chest pain, denies DOE ?Pulmonary: denies cough, denies increased SOB ?Abdomen: endorses good appetite, denies constipation, endorses  continence of bowel ?GU: denies dysuria, endorses continence of urine but has had increased urinary frequency observed per daughter ?MSK:  denies increased weakness, no falls reported, endorses pain of multiple weight bearing joints with exertion ?Skin: denies rashes or wounds ?Neurological: denies insomnia ?Psych: Endorses positive mood ?Heme/lymph/immuno: denies bruises, abnormal bleeding ? ?Physical Exam: ?Current and past weights: 200 lbs as of 04/07/21 ?Constitutional: NAD ?General: WD, obese  ?EYES: anicteric sclera, lids intact, no discharge  ?ENMT: hard of hearing, oral mucous membranes moist, dentition intact ?CV: S1S2, RRR, 1+ non-pitting BLE edema ?Pulmonary: CTAB, no increased work of breathing, no cough, room air ?Abdomen: normo-active BS + 4 quadrants, soft and non tender, no ascites ?GU: deferred ?MSK: no sarcopenia, moves all extremities, ambulatory ?Skin: warm and dry, no rashes or wounds on visible skin ?Neuro:  no generalized weakness,  noted short term memory deficits ?Psych: non-anxious affect, A and O x 3 ?Hem/lymph/immuno: no widespread bruising ? ? ?Thank you for the opportunity to participate in the care of Ms. Jaskowiak.  The palliative care team will continue to follow. Please call our office at [phone] if we can be of additional assistance.  ? ?Marijo Conception, FNP -C ? ?COVID-19 PATIENT SCREENING TOOL ?Asked and negative response unless otherwise noted:  ? ?Have you had symptoms of covid, tested positive or been in contact with someone with symptoms/positive test in the past 5-10 days? no ? ?

## 2021-09-05 DIAGNOSIS — R350 Frequency of micturition: Secondary | ICD-10-CM | POA: Insufficient documentation

## 2021-09-05 DIAGNOSIS — M2550 Pain in unspecified joint: Secondary | ICD-10-CM | POA: Insufficient documentation

## 2021-09-15 ENCOUNTER — Telehealth: Payer: Self-pay | Admitting: Family Medicine

## 2021-09-15 NOTE — Telephone Encounter (Signed)
Received email from patient's daughter [name] requesting results of UA.  Spoke with facility staff to find UA results that were not on the chart.  Unable to locate results. [name] had indicated that pt had been voiding 10 times in a recent visit with her mother.  Will empirically treat for UTI with Ciprofloxacin 250 mg po BID x 5 days.  TCT pt's daughter [name] and advised of empirical treatment for UTI and that pt should begin to improve by Friday.  She verbalized appreciation. ?Damaris Hippo FNP-C ?

## 2021-09-23 ENCOUNTER — Encounter: Payer: Self-pay | Admitting: Family Medicine

## 2021-09-23 ENCOUNTER — Non-Acute Institutional Stay: Payer: Medicare Other | Admitting: Family Medicine

## 2021-09-23 VITALS — BP 124/76 | HR 72 | Resp 16

## 2021-09-23 DIAGNOSIS — E890 Postprocedural hypothyroidism: Secondary | ICD-10-CM

## 2021-09-23 DIAGNOSIS — R350 Frequency of micturition: Secondary | ICD-10-CM

## 2021-09-23 NOTE — Progress Notes (Signed)
[facility] Palliative Care Consult Note Telephone: [phone]  Fax: [phone]    Date of encounter: 09/23/21 1:26 PM PATIENT NAME: Doris Gray [address] [place] [address]   [phone] (home)  DOB: 03-Nov-1930 MRN: [id number] PRIMARY CARE PROVIDER:    Dorothyann Peng, NP,  [place] [address] [phone]  REFERRING PROVIDER:   Dorothyann Peng, NP [address] [place],  [place] [address] [phone]  RESPONSIBLE PARTY:    Contact Information     Name Relation Home Work [place] Daughter   [phone]   Raoul Pitch [phone]          I met face to face with patient and family in her assisted living facility. Palliative Care was asked to follow this patient by consultation request of  Nafziger, Tommi Rumps, NP to address advance care planning and complex medical decision making. This is a follow up visit.                                   ASSESSMENT, SYMPTOM MANAGEMENT AND PLAN / RECOMMENDATIONS:   Urinary frequency Unrelated to infection, may be driven by possible constipation or OAB. Monitor symptoms and frequency of stools as possible.  2.    Postablative hypothyroidism Discussed with pt and family the possibility that pt could get better control on brand Synthroid due to 10-15% difference in generic quantity of active med could be hindering improved control. Will trial Synthroid (DAW) 137 mcg Monday, Wednesday and Friday with Synthroid (DAW) 125 mcg daily on Tues, Thurs, Sat and Sun.  Advised if no significant difference can resume generic.  Advance Care Planning/Goals of Care: Goals include to maximize quality of life and symptom management.  CODE STATUS: MOST as of 09/02/21 DNR/DNI/comfort Antibiotics/IV fluids and feeding tube on case by case time limited basis   Follow up Palliative Care Visit: Palliative care will continue to follow for complex  medical decision making, advance care planning, and clarification of goals. Return 4-6 weeks or prn.   This visit was coded based on medical decision making (MDM).  PPS: 60%  HOSPICE ELIGIBILITY/DIAGNOSIS: TBD  Chief Complaint:  Palliative Care is following for medical management in setting of dementia with increased urinary frequency.   HISTORY OF PRESENT ILLNESS:  Doris Gray is a 88 y.o. year old female with dementia who had recent c/o urinary frequency and increased confusion.  Daughter Doris Gray states she took her home to have dinner with the family and noted in the 3 hours she was there she had to go to the bathroom 5-6 times.  Denies fever, urinary burning, back or flank pain, nausea or vomiting.  Nursing staff at the facility indicated that the UA dipstick that was obtained was negative and didn't meet criteria for send out for culture.  Nursing says she has not observed similar behaviors with patient who has been in the dining room recently for 2 hours at a time but has not gotten up to go to the restroom during that time.  She believes this to be behavioral and related to her dementia. Pt is eating well and her daughter stated she thought the patient had her meal at the facility before coming to her home and eating a 2nd dinner.  Pt introduced herself to me on entry and again at least twice during the visit.   Daughter Doris Gray states that  the patient's TSH is still not precisely where the provider wants it but did indicate that her provider didn't want to make further adjustments to her dose and last TSH in February was 7.  She alternates lower and higher dose on alternate days to obtain control she has.   History obtained from review of EMR, interview with family, facility staff and/or [name].  I reviewed available labs, medications, imaging, studies and related documents from the EMR.  Records reviewed and summarized above.   ROS General: NAD EYES: denies vision changes ENMT:  denies dysphagia Cardiovascular: denies chest pain, denies DOE Pulmonary: denies cough, denies increased SOB Abdomen: endorses good appetite, denies constipation, endorses continence of bowel GU: denies dysuria, endorses continence of urine MSK:  denies increased weakness, no falls reported Skin: denies rashes or wounds Neurological: denies pain, denies insomnia Psych: Endorses positive mood Heme/lymph/immuno: denies bruises, abnormal bleeding  Physical Exam: Current and past weights: unable to locate aid for most current weight, last weight in system 04/07/22 was 200 lbs Constitutional: NAD General: WNWD/obese  EYES: anicteric sclera, lids intact, no discharge  ENMT: intact hearing, oral mucous membranes moist, dentition intact CV: S1S2, RRR, no LE edema Pulmonary: CTAB, no increased work of breathing, no cough, room air Abdomen: intake 100%, normo-active BS + 4 quadrants, soft and non tender, no ascites GU: deferred MSK: no sarcopenia, moves all extremities, ambulatory Skin: warm and dry, no rashes or wounds on visible skin Neuro:  no generalized weakness,  noted  cognitive impairment with short term memory deficits and repetition Psych: non-anxious affect, A and O x 3 Hem/lymph/immuno: no widespread bruising   Thank you for the opportunity to participate in the care of [name].  The palliative care team will continue to follow. Please call our office at [phone] if we can be of additional assistance.   Marijo Conception, FNP -C  COVID-19 PATIENT SCREENING TOOL Asked and negative response unless otherwise noted:   Have you had symptoms of covid, tested positive or been in contact with someone with symptoms/positive test in the past 5-10 days?  No

## 2021-10-01 ENCOUNTER — Encounter: Payer: Self-pay | Admitting: Adult Health

## 2022-04-07 ENCOUNTER — Encounter: Payer: Self-pay | Admitting: Adult Health

## 2022-04-08 ENCOUNTER — Non-Acute Institutional Stay: Payer: Medicare Other | Admitting: Family Medicine

## 2022-04-08 ENCOUNTER — Encounter: Payer: Self-pay | Admitting: Family Medicine

## 2022-04-08 VITALS — BP 148/88 | HR 69 | Temp 96.6°F | Resp 18

## 2022-04-08 DIAGNOSIS — R350 Frequency of micturition: Secondary | ICD-10-CM

## 2022-04-08 DIAGNOSIS — F03B18 Unspecified dementia, moderate, with other behavioral disturbance: Secondary | ICD-10-CM

## 2022-04-08 NOTE — Progress Notes (Signed)
[facility] Consult Note Telephone: [phone]  Fax: [phone]    Date of encounter: 04/08/22 5:52 PM PATIENT NAME: Doris Gray [address] [place] [address]   [phone] (home)  DOB: 1930/05/20 MRN: [id number] PRIMARY CARE PROVIDER:    Dorothyann Peng, NP,  [place] [address] [phone]  REFERRING PROVIDER:   Dorothyann Peng, NP [place] [place],  [place] [address] [phone]  RESPONSIBLE PARTY:    Contact Information     Name Relation Home Work [place] Daughter   [phone]   Doris Gray [phone]          I met face to face with patient in [facility]. Palliative Care was asked to follow this patient by consultation request of  Doris Gray, Doris Rumps, NP to address advance care planning and complex medical decision making. This is a follow up visit   ASSESSMENT , SYMPTOM MANAGEMENT AND PLAN / RECOMMENDATIONS:   Moderate dementia with other behavioral disturbance Difficult at present to ascertain if this is progression of pt's disease versus acute delirium superimposed on dementia. Has follow up with PCP tomorrow, will relay message about visit today in [place].  2.    Urinary frequency with foul odor Possible UTI, will defer to PCP to see if he wants to treat with antibiotics empirically versus obtaining a specimen for testing. Pt incontinent.  Advance Care Planning/Goals of Care: Goals include to maximize quality of life and symptom management.  Identification of a healthcare agent-Doris Gray and sister Doris Gray Review of an advance directive document-MOST . Decision not to resuscitate or to de-escalate disease focused treatments due to poor prognosis. CODE STATUS:  MOST as of 09/02/21: DNR/DNI/Comfort measures Antibiotics, IV fluids and feeding tubes on a case by case time limited basis    Follow up  Palliative Care Visit: Palliative care will continue to follow for complex medical decision making, advance care planning, and clarification of goals. Return 4 weeks or prn.   This visit was coded based on medical decision making (MDM).  PPS: 50%  HOSPICE ELIGIBILITY/DIAGNOSIS: TBD  Chief Complaint:  Palliative Care is continuing to follow patient for chronic medical management in setting of dementia. Facility staff c/o pt not sleeping and being more intrusive, sundowning in the evenings.   HISTORY OF PRESENT ILLNESS:  Doris Gray is a 88 y.o. year old female with dementia, splenic artery aneurysm, HTN, NAFL disease, hypothyroidism, melanoma, vitamin B12 deficiency, arthralgia of multiple sites and urinary frequency.  Eating well per staff, did not eat much dinner. Not sleeping much at night, although pt states she does sleep well.  Per staff, has follow up tomorrow with PCP.  Has urine with foul odor.  No recognition of provider as having seen her before, introduced self and she thought provider was saying her daughter's name was Doris Gray.  She was perseverating on getting dinner dishes cleaned up and put away, for someone to "do their damn job".  She expressed concern about other residents without any recognition of who they were. Orders were written by resident NP at [facility] for TSH, vitamin B12,folic acid, cbc, and BMP on 03/23/22 Acetaminophen scheduled BID dosing decreased from 1000 mg to 650 mg BID.  History obtained from review of EMR, discussion with facility staff and/or Doris Gray.  I reviewed EMR for available labs, medications, imaging, studies and related documents.  There are no new records since last visit available to  review.   ROS Unable to obtain any reliable hx from patient  Physical Exam: Current and past weights: 200 lbs on 04/07/21 Constitutional: NAD General: WD/obese   ENMT: intact hearing CV: S1S2, RRR, trace BLE edema Pulmonary: CTAB, no increased work of  breathing, no cough, room air Abdomen:  normo-active BS + 4 quadrants, soft and non tender GU: deferred MSK: no sarcopenia, moves all extremities, ambulatory Skin: warm and dry, no rashes or wounds on visible skin Neuro:  no generalized weakness,  noted cognitive impairment Psych: mod anxious affect, perseverative,  A and O to self,  Hem/lymph/immuno: no widespread bruising   Thank you for the opportunity to participate in the care of Doris Gray.  The palliative care team will continue to follow. Please call our office at [phone] if we can be of additional assistance.   Doris Conception, FNP -C  COVID-19 PATIENT SCREENING TOOL Asked and negative response unless otherwise noted:   Have you had symptoms of covid, tested positive or been in contact with someone with symptoms/positive test in the past 5-10 days? unknown

## 2022-04-08 NOTE — Telephone Encounter (Signed)
FYI. I scheduled pt for tomorrow

## 2022-04-09 ENCOUNTER — Encounter: Payer: Self-pay | Admitting: Adult Health

## 2022-04-09 ENCOUNTER — Ambulatory Visit (INDEPENDENT_AMBULATORY_CARE_PROVIDER_SITE_OTHER): Payer: Medicare Other | Admitting: Adult Health

## 2022-04-09 VITALS — BP 140/70 | HR 73 | Temp 97.7°F | Ht 63.0 in | Wt 204.0 lb

## 2022-04-09 DIAGNOSIS — E890 Postprocedural hypothyroidism: Secondary | ICD-10-CM

## 2022-04-09 DIAGNOSIS — F05 Delirium due to known physiological condition: Secondary | ICD-10-CM

## 2022-04-09 DIAGNOSIS — N3000 Acute cystitis without hematuria: Secondary | ICD-10-CM | POA: Diagnosis not present

## 2022-04-09 LAB — POCT URINALYSIS DIPSTICK
Bilirubin, UA: NEGATIVE
Blood, UA: NEGATIVE
Glucose, UA: NEGATIVE
Ketones, UA: NEGATIVE
Nitrite, UA: NEGATIVE
Protein, UA: NEGATIVE
Spec Grav, UA: 1.015 (ref 1.010–1.025)
Urobilinogen, UA: 0.2 E.U./dL
pH, UA: 5.5 (ref 5.0–8.0)

## 2022-04-09 MED ORDER — CIPROFLOXACIN HCL 500 MG PO TABS
500.0000 mg | ORAL_TABLET | Freq: Two times a day (BID) | ORAL | 0 refills | Status: AC
Start: 2022-04-09 — End: 2022-04-14

## 2022-04-09 MED ORDER — QUETIAPINE FUMARATE 50 MG PO TABS: 50.0000 mg | ORAL_TABLET | Freq: Every day | ORAL | 1 refills | Status: DC

## 2022-04-09 NOTE — Progress Notes (Signed)
Subjective:    Patient ID: Doris Gray, female    DOB: August 10, 1930, 88 y.o.   MRN: [id number]  HPI 88 year old female who is brought in by her daughter today  She resides at [facility] care facility.  Her daughter was informed that the patient has not been sleeping at night and is wandering around the memory care unit all night. This usually starts happening around 9 pm   She seems very anxious at the facility and keeps her coat on in her purse close to her because she is leaving and does not want to be left alone in the room.  Supposedly this has been going on for over a month.  Daughter is also worried because she has put on a lot of weight she does have a history of hypothyroidism. She is currently taking Synthroid 125 mcg on Tu/Th/Sat/Sun and 161mg on M/W/F  Wt Readings from Last 3 Encounters:  04/09/22 204 lb (92.5 kg)  04/07/21 200 lb (90.7 kg)  11/18/20 186 lb (84.4 kg)   Review of Systems See HPI   Past Medical History:  Diagnosis Date   Acute cystitis without hematuria 04/07/2021   Acute encephalopathy 04/07/2021   CAD (coronary artery disease)    Chicken pox    Colon polyps    Emphysema of lung ([facility]    History of thyroid surgery    Hypertension    Hypothyroidism    Influenza A 04/07/2021   Melanoma ([facility]    Nodule of right lung    calcified granuloma 2.6 mm on CT from 2017    Splenic artery aneurysm ([name])    2.6 cm    UTI (urinary tract infection) 04/07/2021    Social History   Socioeconomic History   Marital status: Widowed    Spouse name: Not on file   Number of children: Not on file   Years of education: Not on file   Highest education level: Bachelor's degree (e.g., BA, AB, BS)  Occupational History   Occupation: Retired  Tobacco Use   Smoking status: Former   Smokeless tobacco: Never  VScientific laboratory technicianUse: Never used  Substance and Sexual Activity   Alcohol use: Not Currently    Comment: Glass of wine occasionally   Drug  use: Never   Sexual activity: Not on file  Other Topics Concern   Not on file  Social History Narrative   Not on file   Social Determinants of Health   Financial Resource Strain: Not on file  Food Insecurity: Not on file  Transportation Needs: Not on file  Physical Activity: Not on file  Stress: Not on file  Social Connections: Not on file  Intimate Partner Violence: Not on file    Past Surgical History:  Procedure Laterality Date   ANEURYSM COILING N/A    OTHER SURGICAL HISTORY     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   THYROID SURGERY     TONSILLECTOMY AND ADENOIDECTOMY      Family History  Adopted: Yes    Allergies  Allergen Reactions   Amlodipine Other (See Comments)    Pt denies    Codeine    Statins     Myalgia    Sulfa Antibiotics    Sulfamethoxazole-Trimethoprim Diarrhea    AKA Bactrim   Sulfasalazine Other (See Comments) and Diarrhea   Tetanus Toxoid Swelling    Had arm swelling  Pt denies    Current Outpatient Medications on File Prior to Visit  Medication Sig  Dispense Refill   fexofenadine (ALLEGRA) 180 MG tablet Take 180 mg by mouth daily.     folic acid (FOLVITE) 1 MG tablet Take 1 tablet (1 mg total) by mouth daily. 30 tablet 1   levothyroxine (SYNTHROID) 125 MCG tablet Take 1 tablet (125 mcg total) by mouth every Tuesday, Thursday, Saturday, and Sunday. 90 tablet 2   levothyroxine (SYNTHROID) 137 MCG tablet Take 1 tablet (137 mcg total) by mouth every Monday, Wednesday, and Friday. 90 tablet 3   losartan (COZAAR) 50 MG tablet Take 1 tablet (50 mg total) by mouth daily. 90 tablet 3   Multiple Vitamins-Minerals (CEROVITE SENIOR) TABS Take 1 tablet by mouth daily. 60 tablet 0   No current facility-administered medications on file prior to visit.    BP (!) 140/70   Pulse 73   Temp 97.7 F (36.5 C) (Oral)   Ht '5\' 3"'$  (1.6 m)   Wt 204 lb (92.5 kg)   SpO2 97%   BMI 36.14 kg/m       Objective:   Physical Exam Vitals and nursing note reviewed.   Constitutional:      Appearance: Normal appearance.  Cardiovascular:     Rate and Rhythm: Normal rate and regular rhythm.     Pulses: Normal pulses.     Heart sounds: Normal heart sounds.  Pulmonary:     Effort: Pulmonary effort is normal.     Breath sounds: Normal breath sounds.  Musculoskeletal:        General: Normal range of motion.     Right lower leg: Edema present.     Left lower leg: Edema present.  Skin:    General: Skin is warm and dry.     Capillary Refill: Capillary refill takes less than 2 seconds.  Neurological:     General: No focal deficit present.     Mental Status: She is alert. Mental status is at baseline. She is disoriented.     Comments: Pleasantly confused   Psychiatric:        Mood and Affect: Mood normal.        Behavior: Behavior normal.        Thought Content: Thought content normal.        Judgment: Judgment normal.       Assessment & Plan:  1. SunDown syndrome  - POC Urinalysis Dipstick - 3 + leuks.  - UTI likely contributing to sundowning. Will treat UTI and place on Seroquel 50 mg QHS.  - QUEtiapine (SEROQUEL) 50 MG tablet; Take 1 tablet (50 mg total) by mouth at bedtime.  Dispense: 90 tablet; Refill: 1  2. Acute cystitis without hematuria  - ciprofloxacin (CIPRO) 500 MG tablet; Take 1 tablet (500 mg total) by mouth 2 (two) times daily for 5 days.  Dispense: 10 tablet; Refill: 0 - Urine Culture; Future - Urine Culture  3. Postablative hypothyroidism - Consider dose change  - TSH; Future - TSH   Dorothyann Peng, NP

## 2022-04-09 NOTE — Patient Instructions (Addendum)
It was great meeting you today   I am going to check your thyroid level today   It looks like you also have a UTI - I have prescribed an antibiotic called Cipro  - take as directed.   I would also like to have you take a Gummie Multi Vitamin instead of the vitamin you are taking now   You can stop getting ensure nutritional supplement   I would like you to drink more water - you can have it at dinner and in your room.   I am also going to add a medication called Seroquel - take this at night time.   Please elevate your legs when you are at rest

## 2022-04-10 LAB — URINE CULTURE
MICRO NUMBER:: 14258183
Result:: NO GROWTH
SPECIMEN QUALITY:: ADEQUATE

## 2022-04-10 LAB — TSH: TSH: 1.64 mIU/L (ref 0.40–4.50)

## 2022-04-18 ENCOUNTER — Encounter: Payer: Self-pay | Admitting: Adult Health

## 2022-04-20 NOTE — Telephone Encounter (Signed)
Please advise 

## 2022-04-21 ENCOUNTER — Other Ambulatory Visit: Payer: Self-pay | Admitting: Adult Health

## 2022-04-21 DIAGNOSIS — F05 Delirium due to known physiological condition: Secondary | ICD-10-CM

## 2022-04-21 MED ORDER — QUETIAPINE FUMARATE 100 MG PO TABS: 100.0000 mg | ORAL_TABLET | Freq: Every day | ORAL | 0 refills | Status: DC

## 2022-04-26 ENCOUNTER — Encounter: Payer: Self-pay | Admitting: Adult Health

## 2022-04-28 ENCOUNTER — Encounter: Payer: Medicare Other | Admitting: Family Medicine

## 2022-04-30 NOTE — Telephone Encounter (Signed)
I see the previous note but do not see the form. Pt daughter did not stop by will look into this next week.

## 2022-05-04 NOTE — Telephone Encounter (Signed)
PPW found. This was faxed 04/21/2022. Faxed again this morning and updated [name] ( pt daughter). Per [name] she would like to pick the prescription order up just in case the office does not receive them again. Rx placed in front office filing cabinet.

## 2022-05-05 ENCOUNTER — Non-Acute Institutional Stay: Payer: Medicare Other | Admitting: Family Medicine

## 2022-05-05 VITALS — BP 148/78 | Temp 98.2°F | Resp 18

## 2022-05-05 DIAGNOSIS — R350 Frequency of micturition: Secondary | ICD-10-CM

## 2022-05-05 DIAGNOSIS — R410 Disorientation, unspecified: Secondary | ICD-10-CM

## 2022-05-05 DIAGNOSIS — R079 Chest pain, unspecified: Secondary | ICD-10-CM

## 2022-05-05 DIAGNOSIS — M79622 Pain in left upper arm: Secondary | ICD-10-CM

## 2022-05-05 NOTE — Progress Notes (Unsigned)
[facility] Consult Note Telephone: [phone]  Fax: [phone]    Date of encounter: 05/05/22 4:06 PM PATIENT NAME: Doris Gray [name] [place] [address]   [phone] (home)  DOB: 1930/06/15 MRN: [id number] PRIMARY CARE PROVIDER:    Dorothyann Peng, NP,  [place] [address] [phone]  REFERRING PROVIDER:   Dorothyann Peng, NP [address] [place],  [place] [address] [phone]  RESPONSIBLE PARTY:    Contact Information     Name Relation Home Work [place] Daughter   [phone]   Raoul Pitch [phone]          I met face to face with patient in [facility]. Palliative Care was asked to follow this patient by consultation request of  Gray, Tommi Rumps, NP to address advance care planning and complex medical decision making. This is a follow up visit   ASSESSMENT , SYMPTOM MANAGEMENT AND PLAN / RECOMMENDATIONS:   Moderate dementia with other behavioral disturbance Difficult at present to ascertain if this is progression of pt's disease versus acute delirium superimposed on dementia. Has follow up with PCP tomorrow, will relay message about visit today in [place].  2.    Urinary frequency with foul odor Possible UTI, will defer to PCP to see if he wants to treat with antibiotics empirically versus obtaining a specimen for testing. Pt incontinent.  Advance Care Planning/Goals of Care: Goals include to maximize quality of life and symptom management.  Identification of a healthcare agent-Doris Gray and sister Doris Gray Review of an advance directive document-MOST . Decision not to resuscitate or to de-escalate disease focused treatments due to poor prognosis. CODE STATUS:  MOST as of 09/02/21: DNR/DNI/Comfort measures Antibiotics, IV fluids and feeding tubes on a case by case time limited basis    Follow up  Palliative Care Visit: Palliative care will continue to follow for complex medical decision making, advance care planning, and clarification of goals. Return 4 weeks or prn.   This visit was coded based on medical decision making (MDM).  PPS: 50%  HOSPICE ELIGIBILITY/DIAGNOSIS: TBD  Chief Complaint:  Acute visit for c/o left chest pain referred to arm and upper back. Spoke with daughter who does not want pt transferred to the hospital but is interested in pt's comfort.     HISTORY OF PRESENT ILLNESS:  Doris Gray is a 88 y.o. year old female with dementia, splenic artery aneurysm, HTN, NAFL disease, hypothyroidism, melanoma, vitamin B12 deficiency, arthralgia of multiple sites and urinary frequency.  VS obtained by ALF staff with BP 172/82, Pulse 79, O2 sat 96%. Received call from pt's daughter earlier that pt was having left sided chest pain, referring down left arm and in back.  She states pt was asking about eating lunch at the time. Pt denied heartburn or indigestion. Daughter does not want pt going out to the hospital but wants her kept comfortable.  On arrival pt was getting up OOB and putting on shoes incorrectly.  Staff reports that she has been very confused more than at baseline.  Pt has not been sleeping well and has been up at night walking.  Recently had increase in Seroquel to 100 mg QHS with no improvement. Pt is very non-sensical about provider taking her out to go somewhere. She has made multiple attempts to go to the restroom and has not voided.  Pt had put on shoe with tongue rolled up against foot.  On 04/26/22 PCP-Doris Nafziger NP wrote for increased Seroquel to 100 mg QHS.  Staff states she could not raise her arm above her head earlier. She was using it to feed herself at lunch and currently is able to grip with both hands equally.   Pt is very disorganized on presentation, continue going through her purse and having difficulty with how it closes.  History obtained from  review of EMR, discussion with facility staff and/or Doris Gray.  I reviewed EMR for available labs, medications, imaging, studies and related documents.  There are no new records since last visit available to review.   ROS Unable to obtain any reliable hx from patient  Physical Exam: Current and past weights: 200 lbs on 04/07/21, 204 lbs on 04/09/22 Constitutional: NAD General: WD/obese   ENMT: intact hearing CV: S1S2, RRR, 2+ non-pitting BLE edema Pulmonary: CTAB, no increased work of breathing, no cough, room air Abdomen:  normo-active BS + 4 quadrants, soft and non tender GU: deferred MSK: no sarcopenia, moves all extremities, ambulatory Skin: warm and dry, no rashes or wounds on visible skin Neuro:  no generalized weakness,  noted cognitive impairment Psych: noted psychomotor agitation  A and O to self,  Hem/lymph/immuno: no widespread bruising   Thank you for the opportunity to participate in the care of Doris Gray.  The palliative care team will continue to follow. Please call our office at [phone] if we can be of additional assistance.   Doris Conception, FNP -C  COVID-19 PATIENT SCREENING TOOL Asked and negative response unless otherwise noted:   Have you had symptoms of covid, tested positive or been in contact with someone with symptoms/positive test in the past 5-10 days? unknown

## 2022-05-06 ENCOUNTER — Encounter: Payer: Self-pay | Admitting: Family Medicine

## 2022-05-06 DIAGNOSIS — R079 Chest pain, unspecified: Secondary | ICD-10-CM | POA: Insufficient documentation

## 2022-05-06 DIAGNOSIS — R410 Disorientation, unspecified: Secondary | ICD-10-CM | POA: Insufficient documentation

## 2022-05-06 DIAGNOSIS — M79622 Pain in left upper arm: Secondary | ICD-10-CM | POA: Insufficient documentation

## 2022-05-19 ENCOUNTER — Encounter: Payer: Self-pay | Admitting: Adult Health

## 2022-05-21 ENCOUNTER — Encounter: Payer: Self-pay | Admitting: Adult Health

## 2022-05-21 ENCOUNTER — Telehealth (INDEPENDENT_AMBULATORY_CARE_PROVIDER_SITE_OTHER): Payer: Medicare Other | Admitting: Adult Health

## 2022-05-21 VITALS — Ht 63.0 in | Wt 204.0 lb

## 2022-05-21 DIAGNOSIS — R600 Localized edema: Secondary | ICD-10-CM

## 2022-05-21 DIAGNOSIS — F03B18 Unspecified dementia, moderate, with other behavioral disturbance: Secondary | ICD-10-CM | POA: Diagnosis not present

## 2022-05-21 NOTE — Progress Notes (Signed)
Virtual Visit via Video Note  I connected withNAME@ on 05/21/22 at  1:45 PM EST by a video enabled telemedicine application and verified that I am speaking with the correct person using two identifiers.  Location patient: home Location provider:work or home office Persons participating in the virtual visit:  patient, provider and patients daughter  I discussed the limitations of evaluation and management by telemedicine and the availability of in person appointments. The patient expressed understanding and agreed to proceed.   HPI:   This visit is with the patient's daughter.  Patient with a history of dementia with behavioral disturbances.  She has been very confused and not sleeping well at night, up walking around her unit throughout the night, she was first tried on Seroquel 50 mg and did not have any improvement so we increased to 100 mg roughly a month ago.  Patient's daughter reports that she slept for the first 2 nights when Seroquel was increased to 100 mg but is not sleeping now and seems to be more mentally foggy than she usually is.  She is no longer doing nervous chatting that she was doing before Seroquel. Seems to have some increased edema in hands and feet since starting Seroquel and have had to remove her rings  ROS: See pertinent positives and negatives per HPI.  Past Medical History:  Diagnosis Date   Acute cystitis without hematuria 04/07/2021   Acute encephalopathy 04/07/2021   CAD (coronary artery disease)    Chicken pox    Colon polyps    Emphysema of lung ([place])    History of thyroid surgery    Hypertension    Hypothyroidism    Influenza A 04/07/2021   Melanoma ([place])    Nodule of right lung    calcified granuloma 2.6 mm on CT from 2017    Splenic artery aneurysm ([name])    2.6 cm    UTI (urinary tract infection) 04/07/2021    Past Surgical History:  Procedure Laterality Date   ANEURYSM COILING N/A    OTHER SURGICAL HISTORY     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    THYROID SURGERY     TONSILLECTOMY AND ADENOIDECTOMY      Family History  Adopted: Yes       Current Outpatient Medications:    fexofenadine (ALLEGRA) 180 MG tablet, Take 180 mg by mouth daily., Disp: , Rfl:    folic acid (FOLVITE) 1 MG tablet, Take 1 tablet (1 mg total) by mouth daily., Disp: 30 tablet, Rfl: 1   levothyroxine (SYNTHROID) 125 MCG tablet, Take 1 tablet (125 mcg total) by mouth every Tuesday, Thursday, Saturday, and Sunday., Disp: 90 tablet, Rfl: 2   levothyroxine (SYNTHROID) 137 MCG tablet, Take 1 tablet (137 mcg total) by mouth every Monday, Wednesday, and Friday., Disp: 90 tablet, Rfl: 3   losartan (COZAAR) 50 MG tablet, Take 1 tablet (50 mg total) by mouth daily., Disp: 90 tablet, Rfl: 3   Multiple Vitamins-Minerals (CEROVITE SENIOR) TABS, Take 1 tablet by mouth daily., Disp: 60 tablet, Rfl: 0   QUEtiapine (SEROQUEL) 100 MG tablet, Take 1 tablet (100 mg total) by mouth at bedtime., Disp: 90 tablet, Rfl: 0  EXAM:  VITALS per patient if applicable:  GENERAL: alert, oriented, appears well and in no acute distress  HEENT: atraumatic, conjunttiva clear, no obvious abnormalities on inspection of external nose and ears  NECK: normal movements of the head and neck  LUNGS: on inspection no signs of respiratory distress, breathing rate appears normal, no obvious  gross SOB, gasping or wheezing  CV: no obvious cyanosis  MS: moves all visible extremities without noticeable abnormality. She does have some swelling noted to both hands and both legs. It is hard to tell how much swelling she has over video   PSYCH/NEURO: pleasant and cooperative, no obvious depression or anxiety, speech and thought processing grossly intact  ASSESSMENT AND PLAN:  Discussed the following assessment and plan:  1. Moderate dementia with other behavioral disturbance, unspecified dementia type ([place]) - Will have her stop Seroquel and trial her on Abilify 2 mg QHS to see if we have better results  and decreased edema. Prescription left at front desk to patients daughter to pick up   2. Lower extremity edema       I discussed the assessment and treatment plan with the patient. The patient was provided an opportunity to ask questions and all were answered. The patient agreed with the plan and demonstrated an understanding of the instructions.   The patient was advised to call back or seek an in-person evaluation if the symptoms worsen or if the condition fails to improve as anticipated.   Dorothyann Peng, NP

## 2022-06-02 ENCOUNTER — Encounter: Payer: Self-pay | Admitting: Adult Health

## 2022-06-02 NOTE — Telephone Encounter (Signed)
Please advise 

## 2022-06-14 ENCOUNTER — Other Ambulatory Visit: Payer: Self-pay | Admitting: Adult Health

## 2022-06-14 ENCOUNTER — Encounter: Payer: Self-pay | Admitting: Adult Health

## 2022-06-14 DIAGNOSIS — I999 Unspecified disorder of circulatory system: Secondary | ICD-10-CM

## 2022-06-15 NOTE — Telephone Encounter (Signed)
Please advise 

## 2022-06-15 NOTE — Telephone Encounter (Signed)
Okay for refill?  

## 2022-06-17 ENCOUNTER — Other Ambulatory Visit: Payer: Self-pay | Admitting: *Deleted

## 2022-06-17 ENCOUNTER — Non-Acute Institutional Stay: Payer: Medicare Other | Admitting: Family Medicine

## 2022-06-17 DIAGNOSIS — Z515 Encounter for palliative care: Secondary | ICD-10-CM

## 2022-06-17 DIAGNOSIS — R0989 Other specified symptoms and signs involving the circulatory and respiratory systems: Secondary | ICD-10-CM

## 2022-06-17 DIAGNOSIS — F03B18 Unspecified dementia, moderate, with other behavioral disturbance: Secondary | ICD-10-CM

## 2022-06-17 NOTE — Progress Notes (Signed)
[facility] Palliative [facility] Consult Note Telephone: [phone]  Fax: [phone]    Date of encounter: 06/17/22 5:45 PM PATIENT NAME: Doris Gray [address] [place] [address]   [phone] (home)  DOB: 10-Sep-1930 MRN: [id number] PRIMARY CARE PROVIDER:    Dorothyann Peng, NP,  [place] [address] [phone]  REFERRING PROVIDER:   Dorothyann Peng, NP [address] [place],  [place] [address] [phone]  RESPONSIBLE PARTY:    Contact Information     Name Relation Home Work [place] Daughter   [phone]   Raoul Pitch [phone]          I met face to face with patient in [facility]. Palliative Care was asked to follow this patient by consultation request of  Nafziger, Tommi Rumps, NP to address advance care planning and complex medical decision making. This is a follow up visit   ASSESSMENT , SYMPTOM MANAGEMENT AND PLAN / RECOMMENDATIONS:  Moderate dementia with behavioral disturbance PCP trying a course of Abilify to see if this will improve function for patient and allow her to sleep.   FAST 7 score 6b Appears more at baseline function than at last visit.  Palliative Encounter Has MOST form with daughter wanting to avoid hospitalization and provide comfort in place at facility.  Advance Care Planning/Goals of Care: Goals include to maximize quality of life and symptom management.  Identification of a healthcare agent-Peter South (son) and (daughter) Karna Christmas Review of an advance directive document-MOST . Decision not to resuscitate or to de-escalate disease focused treatments due to poor prognosis. CODE STATUS:  MOST as of 09/02/21: DNR/DNI/Comfort measures Antibiotics, IV fluids and feeding tubes on a case by case time limited basis    Follow up Palliative Care Visit: Palliative care will continue to follow for complex medical  decision making, advance care planning, and clarification of goals. Return 4 weeks or prn.   This visit was coded based on medical decision making (MDM).  PPS: 50%  HOSPICE ELIGIBILITY/DIAGNOSIS: TBD  Chief Complaint:  Palliative Care is continuing to follow patient for chronic medical management in setting of dementia and to assist with behavioral management.     HISTORY OF PRESENT ILLNESS:  Leanndra Gray is a 89 y.o. year old female with dementia, splenic artery aneurysm, HTN, NAFL disease, hypothyroidism, melanoma, vitamin B12 deficiency, arthralgia of multiple sites and urinary frequency.  Pt is seen sitting at the dinner table, awaiting dinner to be served.  When asked if she has any pain or SOB, she states "No, I'm fine thank God."  She is preoccupied and distracted watching the other residents around her. She had a video visit with her PCP Kristeen Miss, NP and was able to really sleep only on the first 2 nights after increasing her Seroquel to 100 mg nightly with some increase in edema and confusion.  PCP changed her to abilify to see if this would help and cause less swelling.  Pt has decreased tolerance for interaction today. VS were not obtained. Facility staff indicates pt is restless in the overnight hours, will go in her room after dinner but will be in and out during the night wandering in the hall.  Her intake is good and she requires increased cueing for clothing selection.  History obtained from review of EMR, discussion with facility staff, and/or Ms. Mckimmy.   I reviewed EMR for available labs, medications, imaging, studies and related documents.  No recent updated documents or labs.  ROS Unable to obtain any reliable hx from patient Denies any c/o's  Physical Exam: Current and past weights: 200 lbs on 04/07/21, 204 lbs on 04/09/22 Constitutional: NAD General: WD/obese   ENMT: intact hearing CV: S1S2, RRR, 2+ non-pitting BLE edema Pulmonary: CTAB, no increased work  of breathing, no cough, room air Abdomen:  normo-active BS + 4 quadrants, soft and non tender GU: deferred MSK: no sarcopenia, moves all extremities, ambulatory Skin: warm and dry, no rashes or wounds on visible skin Neuro:  no generalized weakness,  noted cognitive impairment Psych: noted psychomotor retardation,  A and O to self,  Hem/lymph/immuno: no widespread bruising   Thank you for the opportunity to participate in the care of Doris Gray.  The palliative care team will continue to follow. Please call our office at [phone] if we can be of additional assistance.   Marijo Conception, FNP -C  COVID-19 PATIENT SCREENING TOOL Asked and negative response unless otherwise noted:   Have you had symptoms of covid, tested positive or been in contact with someone with symptoms/positive test in the past 5-10 days? unknown

## 2022-06-20 ENCOUNTER — Encounter: Payer: Self-pay | Admitting: Family Medicine

## 2022-06-25 ENCOUNTER — Encounter: Payer: Self-pay | Admitting: Vascular Surgery

## 2022-06-25 ENCOUNTER — Ambulatory Visit (INDEPENDENT_AMBULATORY_CARE_PROVIDER_SITE_OTHER): Payer: Medicare Other | Admitting: Vascular Surgery

## 2022-06-25 ENCOUNTER — Ambulatory Visit (HOSPITAL_COMMUNITY)
Admission: RE | Admit: 2022-06-25 | Discharge: 2022-06-25 | Disposition: A | Discharge status: Home / Self Care | Payer: Medicare Other | Source: Ambulatory Visit | Attending: Vascular Surgery | Admitting: Vascular Surgery

## 2022-06-25 VITALS — BP 146/79 | HR 67 | Temp 98.0°F | Resp 20 | Ht 63.0 in | Wt 197.0 lb

## 2022-06-25 DIAGNOSIS — R2243 Localized swelling, mass and lump, lower limb, bilateral: Secondary | ICD-10-CM

## 2022-06-25 DIAGNOSIS — R0989 Other specified symptoms and signs involving the circulatory and respiratory systems: Secondary | ICD-10-CM | POA: Insufficient documentation

## 2022-06-25 DIAGNOSIS — I739 Peripheral vascular disease, unspecified: Secondary | ICD-10-CM

## 2022-06-25 LAB — VAS US ABI WITH/WO TBI
Left ABI: 0.71
Right ABI: 0.99

## 2022-06-25 NOTE — Progress Notes (Unsigned)
Office Note     CC: Bilateral lower extremity Swelling Requesting Provider:  Dorothyann Peng, NP  HPI: Doris Gray is a 89 y.o. ([birth year]) female presenting at the request of .Nafziger, Tommi Rumps, NP for bilateral lower extremity Swelling.  On exam today, Doris Gray was doing well accompanied by her daughter.  She is currently living at a memory care facility down the road from the daughter.  Over the last several months, Doris Gray has had significant bilateral lower extremity swelling.  The swelling has led to skin changes in the calves.  She denies claudication, ischemic rest pain, tissue loss, but ambulates only a minimal amount.  The majority of her history was obtained from her daughter due to her dementia.  Doris Gray that her mom has normal mobility throughout the day, and sits in the dependent position for considerable length of time.   Current MOLST form Gray avoiding hospitalization and providing comfort in place at her facility. DNI/ DNI.  Past Medical History:  Diagnosis Date   Acute cystitis without hematuria 04/07/2021   Acute encephalopathy 04/07/2021   CAD (coronary artery disease)    Chicken pox    Colon polyps    Emphysema of lung ([place])    History of thyroid surgery    Hypertension    Hypothyroidism    Influenza A 04/07/2021   Melanoma ([place])    Nodule of right lung    calcified granuloma 2.6 mm on CT from 2017    Splenic artery aneurysm ([name])    2.6 cm    UTI (urinary tract infection) 04/07/2021    Past Surgical History:  Procedure Laterality Date   ANEURYSM COILING N/A    OTHER SURGICAL HISTORY     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   THYROID SURGERY     TONSILLECTOMY AND ADENOIDECTOMY      Social History   Socioeconomic History   Marital status: Widowed    Spouse name: Not on file   Number of children: Not on file   Years of education: Not on file   Highest education level: Bachelor's degree (e.g., BA, AB, BS)  Occupational History   Occupation: Retired   Tobacco Use   Smoking status: Former   Smokeless tobacco: Never  Scientific laboratory technician Use: Never used  Substance and Sexual Activity   Alcohol use: Not Currently    Comment: Glass of wine occasionally   Drug use: Never   Sexual activity: Not on file  Other Topics Concern   Not on file  Social History Narrative   Not on file   Social Determinants of Health   Financial Resource Strain: Not on file  Food Insecurity: Not on file  Transportation Needs: Not on file  Physical Activity: Not on file  Stress: Not on file  Social Connections: Not on file  Intimate Partner Violence: Not on file   Family History  Adopted: Yes    Current Outpatient Medications  Medication Sig Dispense Refill   ARIPiprazole (ABILIFY) 2 MG tablet TAKE ONE TABLET BY MOUTH AT BEDTIME FOR MOOD 90 tablet 1   fexofenadine (ALLEGRA) 180 MG tablet Take 180 mg by mouth daily.     folic acid (FOLVITE) 1 MG tablet Take 1 tablet (1 mg total) by mouth daily. 30 tablet 1   levothyroxine (SYNTHROID) 137 MCG tablet Take 1 tablet (137 mcg total) by mouth every Monday, Wednesday, and Friday. (Patient taking differently: Take 137 mcg by mouth daily.) 90 tablet 3   losartan (COZAAR) 50 MG tablet Take  1 tablet (50 mg total) by mouth daily. 90 tablet 3   Multiple Vitamins-Minerals (CEROVITE SENIOR) TABS Take 1 tablet by mouth daily. 60 tablet 0   No current facility-administered medications for this visit.    Allergies  Allergen Reactions   Amlodipine Other (See Comments)    Pt denies    Codeine    Statins     Myalgia    Sulfa Antibiotics    Sulfamethoxazole-Trimethoprim Diarrhea    AKA Bactrim   Sulfasalazine Other (See Comments) and Diarrhea   Tetanus Toxoid Swelling    Had arm swelling  Pt denies     REVIEW OF SYSTEMS:  [X]$  denotes positive finding, [ ]$  denotes negative finding Cardiac  Comments:  Chest pain or chest pressure:    Shortness of breath upon exertion:    Short of breath when lying flat:     Irregular heart rhythm:        Vascular    Pain in calf, thigh, or hip brought on by ambulation:    Pain in feet at night that wakes you up from your sleep:     Blood clot in your veins:    Leg swelling:         Pulmonary    Oxygen at home:    Productive cough:     Wheezing:         Neurologic    Sudden weakness in arms or legs:     Sudden numbness in arms or legs:     Sudden onset of difficulty speaking or slurred speech:    Temporary loss of vision in one eye:     Problems with dizziness:         Gastrointestinal    Blood in stool:     Vomited blood:         Genitourinary    Burning when urinating:     Blood in urine:        Psychiatric    Major depression:         Hematologic    Bleeding problems:    Problems with blood clotting too easily:        Skin    Rashes or ulcers:        Constitutional    Fever or chills:      PHYSICAL EXAMINATION:  Vitals:   06/25/22 1604  BP: (!) 146/79  Pulse: 67  Resp: 20  Temp: 98 F (36.7 C)  SpO2: 94%  Weight: 197 lb (89.4 kg)  Height: 5' 3"$  (1.6 m)    General:  WDWN in NAD; vital signs documented above Gait: Not observed HENT: WNL, normocephalic Pulmonary: normal non-labored breathing , without wheezing Cardiac: regular HR Abdomen: soft, NT, no masses Skin: without rashes Vascular Exam/Pulses:  Right Left  Radial 1+ (weak) 1+ (weak)  Ulnar    Femoral    Popliteal    DP 1+ (weak) absent  PT     Extremities: without ischemic changes, without Gangrene , without cellulitis; without open wounds;  Musculoskeletal: no muscle wasting or atrophy  Neurologic: A&O X 3;  No focal weakness or paresthesias are detected Psychiatric:  The pt has Normal affect.   Non-Invasive Vascular Imaging:   ABI Findings:  +---------+------------------+-----+----------+--------+  Right   Rt Pressure (mmHg)IndexWaveform  Comment   +---------+------------------+-----+----------+--------+  Brachial 160                                         +---------+------------------+-----+----------+--------+  PTA     158               0.99 triphasic           +---------+------------------+-----+----------+--------+  DP      133               0.83 monophasic          +---------+------------------+-----+----------+--------+  Great Toe175               1.09 Normal              +---------+------------------+-----+----------+--------+   +---------+------------------+-----+----------+-------+  Left    Lt Pressure (mmHg)IndexWaveform  Comment  +---------+------------------+-----+----------+-------+  Brachial 145                                       +---------+------------------+-----+----------+-------+  PTA     94                0.59 monophasic         +---------+------------------+-----+----------+-------+  DP      114               0.71 monophasic         +---------+------------------+-----+----------+-------+  Great Toe59                0.37 Abnormal           +---------+------------------+-----+----------+-------+   +-------+-----------+-----------+------------+------------+  ABI/TBIToday's ABIToday's TBIPrevious ABIPrevious TBI  +-------+-----------+-----------+------------+------------+  Right 0.99       1.09                                 +-------+-----------+-----------+------------+------------+  Left  0.71       0.37                                 +-------+-----------+-----------+------------+------------+     ASSESSMENT/PLAN: Doris Gray is a 89 y.o. female presenting with bilateral lower extremity swelling in the calves which has been present for a number of months.  She denies symptoms of claudication, ischemic rest pain, tissue loss at the level of the toes.    Lower extremity edema has a variety of etiologies, especially in an 89 year old who is relatively nonambulatory, and sits in the dependent position for a large portion of  the day.    Unfortunately with her peripheral arterial disease in the left leg, compression is suboptimal.  Furthermore, in talking with Doris Gray, she stated her mother would not tolerate any amount of compression regardless of the prescription.  We discussed the importance of elevation when able, specifically elevation above the level of the heart.  Doris Gray may benefit from a small dose diuretic, but we also discussed that this can lead to further issues such as dehydration and renal complications in her age group.  I will leave this up to her primary.   There is no indication for lower extremity intervention for peripheral arterial disease at this time.  Bilateral symptoms which have been present for months does not warrant DVT workup.  I would not perform intervention should chronic venous stasis to be found, therefore I do not recommend lower extremity venous duplex ultrasounds.  Recommend elevation, possible diuretic administration, echo to evaluate cardiac function, low-sodium diet. Should edema continue, can attempt low-grade compression stockings.  My plan is to see Doris Gray in 1 years time for peripheral arterial disease follow-up.  I asked her to call my office should new onset foot wounds, rest pain occur.    Doris John, MD [facility] [phone]

## 2022-06-28 ENCOUNTER — Encounter: Payer: Self-pay | Admitting: Adult Health

## 2022-06-29 NOTE — Telephone Encounter (Signed)
Please advise 

## 2022-07-19 ENCOUNTER — Encounter: Payer: Self-pay | Admitting: Adult Health

## 2022-07-20 NOTE — Telephone Encounter (Signed)
Please advise   Looks like they were advised to f/u with vascular provider if sx were no better or worsened.

## 2022-07-21 ENCOUNTER — Encounter: Payer: Self-pay | Admitting: Adult Health

## 2022-07-21 ENCOUNTER — Ambulatory Visit (INDEPENDENT_AMBULATORY_CARE_PROVIDER_SITE_OTHER): Payer: Medicare Other | Admitting: Adult Health

## 2022-07-21 VITALS — BP 138/70 | HR 60 | Temp 97.9°F | Ht 63.0 in | Wt 197.0 lb

## 2022-07-21 DIAGNOSIS — F03B18 Unspecified dementia, moderate, with other behavioral disturbance: Secondary | ICD-10-CM | POA: Diagnosis not present

## 2022-07-21 DIAGNOSIS — F05 Delirium due to known physiological condition: Secondary | ICD-10-CM | POA: Diagnosis not present

## 2022-07-21 DIAGNOSIS — R600 Localized edema: Secondary | ICD-10-CM

## 2022-07-21 DIAGNOSIS — E039 Hypothyroidism, unspecified: Secondary | ICD-10-CM

## 2022-07-21 LAB — TSH: TSH: 0.81 u[IU]/mL (ref 0.35–5.50)

## 2022-07-21 NOTE — Progress Notes (Signed)
Subjective:    Patient ID: Doris Gray, female    DOB: Feb 17, 1931, 89 y.o.   MRN: [id number]  HPI 89 year old female who  has a past medical history of Acute cystitis without hematuria (04/07/2021), Acute encephalopathy (04/07/2021), CAD (coronary artery disease), Chicken pox, Colon polyps, Emphysema of lung ([place]), History of thyroid surgery, Hypertension, Hypothyroidism, Influenza A (04/07/2021), Melanoma ([place]), Nodule of right lung, Splenic artery aneurysm ([place]), and UTI (urinary tract infection) (04/07/2021).  She presents with her daughter today for follow up regarding lower extremity edema.  She was seen by vein and vascular roughly 1 month ago for significant bilateral lower extremity edema.  She does sit in a dependent position for considerable length time but has normal mobility throughout the day.  She had an ultrasound done which showed peripheral artery disease in the left leg.  Unfortunately she is not able to tolerate any amount of compression.  It was also thought that she may benefit from a small dose diuretic.   Prescribed her Lasix 20 mg daily but there has not been any improvement in her symptoms.  Her daughter is also wondering if she can take Melatonin to help with insomnia/sundowning. She is currently taking Abilify 5 mg ( there was a mix up at the facility with her medications and she just started taking the Abilify   Her daughter also reports that her facility messed up her synthroid levels. She is currently supposed to be taking Synthroid 125 mcg 4 days a week and Synthoird 137 mcg 3 days a week. Her daughter reports that they have only been giving her 125 mcg dose during the week.    Review of Systems See HPI   Past Medical History:  Diagnosis Date   Acute cystitis without hematuria 04/07/2021   Acute encephalopathy 04/07/2021   CAD (coronary artery disease)    Chicken pox    Colon polyps    Emphysema of lung ([place])    History of thyroid surgery     Hypertension    Hypothyroidism    Influenza A 04/07/2021   Melanoma ([place])    Nodule of right lung    calcified granuloma 2.6 mm on CT from 2017    Splenic artery aneurysm ([name])    2.6 cm    UTI (urinary tract infection) 04/07/2021    Social History   Socioeconomic History   Marital status: Widowed    Spouse name: Not on file   Number of children: Not on file   Years of education: Not on file   Highest education level: Bachelor's degree (e.g., BA, AB, BS)  Occupational History   Occupation: Retired  Tobacco Use   Smoking status: Former   Smokeless tobacco: Never  Scientific laboratory technician Use: Never used  Substance and Sexual Activity   Alcohol use: Not Currently    Comment: Glass of wine occasionally   Drug use: Never   Sexual activity: Not on file  Other Topics Concern   Not on file  Social History Narrative   Not on file   Social Determinants of Health   Financial Resource Strain: Not on file  Food Insecurity: Not on file  Transportation Needs: Not on file  Physical Activity: Not on file  Stress: Not on file  Social Connections: Not on file  Intimate Partner Violence: Not on file    Past Surgical History:  Procedure Laterality Date   ANEURYSM COILING N/A    OTHER SURGICAL HISTORY  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   THYROID SURGERY     TONSILLECTOMY AND ADENOIDECTOMY      Family History  Adopted: Yes    Allergies  Allergen Reactions   Amlodipine Other (See Comments)    Pt denies    Codeine    Statins     Myalgia    Sulfa Antibiotics    Sulfamethoxazole-Trimethoprim Diarrhea    AKA Bactrim   Sulfasalazine Other (See Comments) and Diarrhea   Tetanus Toxoid Swelling    Had arm swelling  Pt denies    Current Outpatient Medications on File Prior to Visit  Medication Sig Dispense Refill   ARIPiprazole (ABILIFY) 2 MG tablet TAKE ONE TABLET BY MOUTH AT BEDTIME FOR MOOD 90 tablet 1   fexofenadine (ALLEGRA) 180 MG tablet Take 180 mg by mouth daily.     folic  acid (FOLVITE) 1 MG tablet Take 1 tablet (1 mg total) by mouth daily. 30 tablet 1   levothyroxine (SYNTHROID) 137 MCG tablet Take 1 tablet (137 mcg total) by mouth every Monday, Wednesday, and Friday. (Patient taking differently: Take 137 mcg by mouth daily.) 90 tablet 3   losartan (COZAAR) 50 MG tablet Take 1 tablet (50 mg total) by mouth daily. 90 tablet 3   Multiple Vitamins-Minerals (CEROVITE SENIOR) TABS Take 1 tablet by mouth daily. 60 tablet 0   No current facility-administered medications on file prior to visit.    There were no vitals taken for this visit.      Objective:   Physical Exam Vitals and nursing note reviewed.  Constitutional:      Appearance: Normal appearance.  Cardiovascular:     Rate and Rhythm: Normal rate and regular rhythm.     Pulses: Normal pulses.     Heart sounds: Normal heart sounds.  Pulmonary:     Effort: Pulmonary effort is normal.     Breath sounds: Normal breath sounds.  Musculoskeletal:     Right lower leg: Edema present.     Left lower leg: Edema present.  Skin:    General: Skin is warm and dry.  Neurological:     General: No focal deficit present.     Mental Status: She is alert and oriented to person, place, and time.  Psychiatric:        Behavior: Behavior is cooperative.        Cognition and Memory: Cognition is impaired. Memory is impaired. She exhibits impaired recent memory and impaired remote memory.       Assessment & Plan:  1. Moderate dementia with other behavioral disturbance, unspecified dementia type ([place]) - OK with adding melatonin 5 mg to her regimen   2. SunDown syndrome - I am ok with adding melatonin 5 mg to her regimen   3. Hypothyroidism, unspecified type  - TSH; Future - TSH  4. Lower extremity edema - Consider increaseing Lasix or adding Torsemide but we have to worry about dehydration and/or kidney impairment  - Basic Metabolic Panel with eGFR; Future - Basic Metabolic Panel with eGFR  Dorothyann Peng,  NP

## 2022-07-22 ENCOUNTER — Telehealth: Payer: Self-pay | Admitting: Adult Health

## 2022-07-22 ENCOUNTER — Other Ambulatory Visit: Payer: Self-pay | Admitting: Adult Health

## 2022-07-22 DIAGNOSIS — R600 Localized edema: Secondary | ICD-10-CM

## 2022-07-22 LAB — BASIC METABOLIC PANEL WITH GFR
BUN: 14 mg/dL (ref 7–25)
CO2: 26 mmol/L (ref 20–32)
Calcium: 10.2 mg/dL (ref 8.6–10.4)
Chloride: 105 mmol/L (ref 98–110)
Creat: 0.94 mg/dL (ref 0.60–0.95)
Glucose, Bld: 99 mg/dL (ref 65–99)
Potassium: 4.1 mmol/L (ref 3.5–5.3)
Sodium: 142 mmol/L (ref 135–146)
eGFR: 58 mL/min/{1.73_m2} — ABNORMAL LOW (ref >=60)

## 2022-07-22 NOTE — Telephone Encounter (Signed)
Updated patients daughter, [name].   Will increase Lasix to 40 mg daily. Come back in 7-10 days for repeat BMP   TSH is WNL

## 2022-08-02 ENCOUNTER — Telehealth: Payer: Self-pay | Admitting: Family Medicine

## 2022-08-03 NOTE — Telephone Encounter (Signed)
08/02/22 0956 AM Returned call to son and [facility] POA.  He states patient tore up her DNR and MOST form.  HeHe wanted to get duplicate DNR/MOST documents and planned to be in town so duplicates could be completed.  He also wanted to know how to display them but keep them out of reach of the patient.  Advised that usually the facility keeps them on the chart.  Advised that we could create duplicates so one original could remain on the chart and the other could be posted up higher where pt couldn't reach but in sight if emergency care workers came.  Appointment was scheduled for in patient's memory care room on Monday May 6th at 3 pm.  Damaris Hippo FNP-C

## 2022-08-25 ENCOUNTER — Non-Acute Institutional Stay: Payer: Medicare Other | Admitting: Family Medicine

## 2022-08-25 VITALS — BP 136/90 | HR 84 | Temp 98.0°F | Resp 18

## 2022-08-25 DIAGNOSIS — F03B18 Unspecified dementia, moderate, with other behavioral disturbance: Secondary | ICD-10-CM

## 2022-08-25 DIAGNOSIS — R600 Localized edema: Secondary | ICD-10-CM

## 2022-08-25 NOTE — Progress Notes (Signed)
Therapist, nutritional Palliative Care Consult Note Telephone: [phone]  Fax: [phone]   Date of encounter: 08/25/22 4:31 PM PATIENT NAME: Doris Gray [address] [place] [address]   [phone] (home)  DOB: 05-26-1930 MRN: [id number] PRIMARY CARE PROVIDER:    Shirline Frees, NP,  [address] [place] [address] [phone]  REFERRING PROVIDER:   Shirline Frees, NP [address] [place],  [place] [address] [phone]  Emergency Contact:    Contact Information     Name Relation Home Work [place] Daughter   [phone]   Blanchie Serve [phone]         Health Care POA/Health Care Agent   I met face to face with patient in [facility]. Palliative Care was asked to follow this patient by consultation request of Nafziger, Kandee Keen, NP to address advance care planning and complex medical decision making. This is a follow up visit.    CODE STATUS: MOST as of 09/02/21: DNR/DNI with comfort measures Use of antibiotics and IV fluids on a case by case, time limited basis No feeding tube.    ASSESSMENT AND / RECOMMENDATIONS:  PPS: 40% Edema:  Lasix recently increased to 20mg  po qd by PCP. Consider home health skilled nursing for compression wraps.   Measure patient for tubigrips and wear daily to maintain and decrease swelling.   Monitor daily weights and report any signs of cellulitis to BLE.   Dementia:  Currently stable on therapy.   Continue current treatment plan without change.  Continue to reorient and assist as needed.          Follow up Palliative Care Visit:  Palliative Care continuing to follow up by monitoring for changes in appetite, weight, functional and cognitive status for chronic disease progression and management in agreement with patient's stated goals of care. Next visit in 4 weeks or prn.   Follow up Palliative Care  Visit:  Palliative Care continuing to follow up by monitoring for changes in appetite, weight, functional and cognitive status for chronic disease progression and management in agreement with patient's stated goals of care. Next visit in 3-4 weeks or prn.  This visit was coded based on medical decision making (MDM).  Chief Complaint  Palliative Care continues to follow patient for chronic medical management in setting of dementia and to assist with refining and defining goals of care.  HISTORY OF PRESENT ILLNESS:  Doris Gray is a 89 year old female with dementia. On approach she says "I'm fine."  Denies CP, SOB, lightheadedness, nausea, vomiting or constipation, PND or orthopnea.   ACTIVITIES OF DAILY LIVING: CONTINENT OF BLADDER/BOWEL? No BATHING/DRESSING/FEEDING: Requires assistance with bathing/dressing, able to feed self with tray setup and prompting    MOBILITY:  Uses walker or Wheelchair    APPETITE? Good  WEIGHT: 197 lbs as of 07/21/22  CURRENT PROBLEM LIST:  Patient Active Problem List   Diagnosis Date Noted   Chest pain 05/06/2022   Left upper arm pain 05/06/2022   Delirium with dementia 05/06/2022   Urinary frequency 09/05/2021   Arthralgia of multiple sites, bilateral 09/05/2021   NAFL (nonalcoholic fatty liver) 06/10/2021   Moderate dementia 06/10/2021   Palliative care encounter 06/10/2021   Vitamin B12 deficiency 06/10/2021   Splenic artery aneurysm    Hypertension    Hypothyroidism    Melanoma    PAST MEDICAL HISTORY:  Active Ambulatory Problems    Diagnosis Date Noted  Splenic artery aneurysm    Hypertension    Hypothyroidism    Melanoma    NAFL (nonalcoholic fatty liver) 06/10/2021   Moderate dementia 06/10/2021   Palliative care encounter 06/10/2021   Vitamin B12 deficiency 06/10/2021   Urinary frequency 09/05/2021   Arthralgia of multiple sites, bilateral 09/05/2021   Chest pain 05/06/2022   Left upper arm pain 05/06/2022   Delirium with  dementia 05/06/2022   Resolved Ambulatory Problems    Diagnosis Date Noted   UTI (urinary tract infection) 04/07/2021   Acute encephalopathy 04/07/2021   Acute cystitis without hematuria 04/07/2021   Influenza A 04/07/2021   Past Medical History:  Diagnosis Date   CAD (coronary artery disease)    Chicken pox    Colon polyps    Emphysema of lung ([name])    History of thyroid surgery    Nodule of right lung    SOCIAL HX:  Social History   Tobacco Use   Smoking status: Former   Smokeless tobacco: Never  Substance Use Topics   Alcohol use: Not Currently    Comment: Glass of wine occasionally   FAMILY HX:  Family History  Adopted: Yes       Preferred Pharmacy: ALLERGIES:  Allergies  Allergen Reactions   Amlodipine Other (See Comments)    Pt denies    Codeine    Statins     Myalgia    Sulfa Antibiotics    Sulfamethoxazole-Trimethoprim Diarrhea    AKA Bactrim   Sulfasalazine Other (See Comments) and Diarrhea   Tetanus Toxoid Swelling    Had arm swelling  Pt denies     PERTINENT MEDICATIONS:  Outpatient Encounter Medications as of 08/25/2022  Medication Sig   ARIPiprazole (ABILIFY) 2 MG tablet TAKE ONE TABLET BY MOUTH AT BEDTIME FOR MOOD (Patient taking differently: Take 5 mg by mouth at bedtime.)   fexofenadine (ALLEGRA) 180 MG tablet Take 180 mg by mouth daily.   levothyroxine (SYNTHROID) 137 MCG tablet Take 1 tablet (137 mcg total) by mouth every Monday, Wednesday, and Friday. (Patient taking differently: Take 137 mcg by mouth daily.)   losartan (COZAAR) 50 MG tablet Take 1 tablet (50 mg total) by mouth daily.   No facility-administered encounter medications on file as of 08/25/2022.    History obtained from review of EMR, discussion with facility staff/caregiver and/or patient.     CMP    Latest Ref Rng & Units 07/21/2022   12:09 PM 07/24/2021    2:58 PM 04/14/2021    5:18 AM  CMP  Glucose 65 - 99 mg/dL 99  161    BUN 7 - 25 mg/dL 14  16    Creatinine  0.96 - 0.95 mg/dL 0.45  4.09  8.11   Sodium 135 - 146 mmol/L 142  141    Potassium 3.5 - 5.3 mmol/L 4.1  3.8    Chloride 98 - 110 mmol/L 105  103    CO2 20 - 32 mmol/L 26  29    Calcium 8.6 - 10.4 mg/dL 91.4  78.2           I reviewed available labs, medications, imaging, studies and related documents from the EMR.  Records reviewed and summarized above.   Physical Exam: GENERAL: NAD LUNGS: CTAB, no increased work of breathing, room air CARDIAC:  S1S2, RRR with no MRG, 3+pitting edema to BLE, No cyanosis ABD:  Normo-active BS x 4 quads, soft, non-tender EXTREMITIES: Decreased ROM due to edema, no deformity, strength equal, No  muscle atrophy/subcutaneous fat loss NEURO:  No weakness, cognitive impairment present, Wandering conversation PSYCH:  non-anxious affect, A & O to self   Thank you for the opportunity to participate in the care of [employer]. Please call our main office at [phone] if we can be of additional assistance.    Joycelyn Man FNP-C  [email or web address]  Phone:  [phone]

## 2022-08-31 ENCOUNTER — Emergency Department (HOSPITAL_BASED_OUTPATIENT_CLINIC_OR_DEPARTMENT_OTHER): Payer: Medicare Other

## 2022-08-31 ENCOUNTER — Emergency Department (HOSPITAL_BASED_OUTPATIENT_CLINIC_OR_DEPARTMENT_OTHER)
Admission: EM | Admit: 2022-08-31 | Discharge: 2022-08-31 | Disposition: A | Discharge status: Home / Self Care | Payer: Medicare Other | Attending: Emergency Medicine | Admitting: Emergency Medicine

## 2022-08-31 ENCOUNTER — Encounter (HOSPITAL_BASED_OUTPATIENT_CLINIC_OR_DEPARTMENT_OTHER): Payer: Self-pay | Admitting: Emergency Medicine

## 2022-08-31 ENCOUNTER — Other Ambulatory Visit: Payer: Self-pay

## 2022-08-31 DIAGNOSIS — R410 Disorientation, unspecified: Secondary | ICD-10-CM | POA: Diagnosis not present

## 2022-08-31 DIAGNOSIS — I2510 Atherosclerotic heart disease of native coronary artery without angina pectoris: Secondary | ICD-10-CM | POA: Insufficient documentation

## 2022-08-31 DIAGNOSIS — Z5329 Procedure and treatment not carried out because of patient's decision for other reasons: Secondary | ICD-10-CM | POA: Insufficient documentation

## 2022-08-31 DIAGNOSIS — F0390 Unspecified dementia without behavioral disturbance: Secondary | ICD-10-CM | POA: Insufficient documentation

## 2022-08-31 DIAGNOSIS — R32 Unspecified urinary incontinence: Secondary | ICD-10-CM | POA: Insufficient documentation

## 2022-08-31 DIAGNOSIS — Z79890 Hormone replacement therapy (postmenopausal): Secondary | ICD-10-CM | POA: Diagnosis not present

## 2022-08-31 DIAGNOSIS — I10 Essential (primary) hypertension: Secondary | ICD-10-CM | POA: Diagnosis not present

## 2022-08-31 DIAGNOSIS — E039 Hypothyroidism, unspecified: Secondary | ICD-10-CM | POA: Insufficient documentation

## 2022-08-31 DIAGNOSIS — J0100 Acute maxillary sinusitis, unspecified: Secondary | ICD-10-CM | POA: Diagnosis not present

## 2022-08-31 DIAGNOSIS — Z79899 Other long term (current) drug therapy: Secondary | ICD-10-CM | POA: Diagnosis not present

## 2022-08-31 DIAGNOSIS — R5383 Other fatigue: Secondary | ICD-10-CM | POA: Diagnosis present

## 2022-08-31 LAB — URINALYSIS, W/ REFLEX TO CULTURE (INFECTION SUSPECTED)
Bilirubin Urine: NEGATIVE
Glucose, UA: NEGATIVE mg/dL
Ketones, ur: NEGATIVE mg/dL
Nitrite: NEGATIVE
Protein, ur: NEGATIVE mg/dL
Specific Gravity, Urine: 1.022 (ref 1.005–1.030)
WBC, UA: >50 WBC/hpf (ref 0–5)
pH: 6 (ref 5.0–8.0)

## 2022-08-31 MED ORDER — AMOXICILLIN-POT CLAVULANATE 875-125 MG PO TABS: 1.0000 | ORAL_TABLET | Freq: Two times a day (BID) | ORAL | 0 refills | Status: DC

## 2022-08-31 NOTE — ED Triage Notes (Addendum)
Runny nose, cough x 1 week From memory care/ confusion. On palliative care, would like eval and home with abt - per daughter Covid/ swap done at care facility, neg per daughter

## 2022-08-31 NOTE — ED Notes (Signed)
Pt discharged home after verbalizing understanding of discharge instructions; nad noted.  Daughter to take pt to facility.

## 2022-08-31 NOTE — ED Notes (Signed)
[facility] from [facility].

## 2022-08-31 NOTE — Discharge Instructions (Signed)
We were unable to workup your mother's presentation to the emergency department and AGAINST MEDICAL ADVICE risks a missed diagnosis which could result in significant morbidity and mortality.  However, due to the symptoms of sinusitis for 10 days, will prescribe a course of Augmentin.

## 2022-08-31 NOTE — ED Provider Notes (Signed)
[facility] EMERGENCY DEPARTMENT AT [facility] Provider Note   CSN: [id number] Arrival date & time: 08/31/22  1727     History  Chief Complaint  Patient presents with   URI    Doris Gray is a 89 y.o. female.   URI    89 year old female with medical history significant for CAD, hypothyroidism, hypertension, UTIs, advanced dementia residing in a memory care unit who presents in the care of of her daughter from [facility] care due to roughly 10 days of sinusitis symptoms.  The patient also had an episode of incontinence yesterday and her daughter is worried that she is having the symptoms of urinary tract infection.  She has appeared more fatigued today.  She endorses lot of green discharge from both nares bilaterally and seems to be complaining of sinus pressure.  No fevers or chills.  She her baseline mental status is alert and oriented x 1 and she is close to her baseline although somewhat more somnolent today.  Home Medications Prior to Admission medications   Medication Sig Start Date End Date Taking? Authorizing Provider  amoxicillin-clavulanate (AUGMENTIN) 875-125 MG tablet Take 1 tablet by mouth every 12 (twelve) hours. 08/31/22  Yes Ernie Avena, MD  ARIPiprazole (ABILIFY) 2 MG tablet TAKE ONE TABLET BY MOUTH AT BEDTIME FOR MOOD Patient taking differently: Take 5 mg by mouth at bedtime. 06/15/22   Nafziger, Kandee Keen, NP  fexofenadine (ALLEGRA) 180 MG tablet Take 180 mg by mouth daily.    [provider]  levothyroxine (SYNTHROID) 137 MCG tablet Take 1 tablet (137 mcg total) by mouth every Monday, Wednesday, and Friday. Patient taking differently: Take 137 mcg by mouth daily. 04/15/21   Kathlen Mody, MD  losartan (COZAAR) 50 MG tablet Take 1 tablet (50 mg total) by mouth daily. 05/23/20   Nafziger, Kandee Keen, NP      Allergies    Amlodipine, Codeine, Statins, Sulfa antibiotics, Sulfamethoxazole-trimethoprim, Sulfasalazine, and Tetanus toxoid    Review of  Systems   Review of Systems  Unable to perform ROS: Dementia    Physical Exam Updated Vital Signs BP (!) 145/81 (BP Location: Right Arm)   Pulse 100   Temp 98.9 F (37.2 C)   Resp 16   SpO2 95%  Physical Exam Vitals and nursing note reviewed.  Constitutional:      General: She is not in acute distress. HENT:     Head: Normocephalic and atraumatic.     Comments: Mild bilateral green sinus discharge, mild tenderness of the maxillary sinuses    Nose:     Right Sinus: Maxillary sinus tenderness present.     Left Sinus: Maxillary sinus tenderness present.  Eyes:     Conjunctiva/sclera: Conjunctivae normal.     Pupils: Pupils are equal, round, and reactive to light.  Cardiovascular:     Rate and Rhythm: Normal rate and regular rhythm.  Pulmonary:     Effort: Pulmonary effort is normal. No respiratory distress.  Abdominal:     General: There is no distension.     Tenderness: There is no guarding.  Musculoskeletal:        General: No deformity or signs of injury.     Cervical back: Neck supple.  Skin:    Findings: No lesion or rash.  Neurological:     General: No focal deficit present.     Mental Status: She is alert.     GCS: GCS eye subscore is 4. GCS verbal subscore is 4. GCS motor subscore is 6.  Cranial Nerves: Cranial nerves 2-12 are intact.     Sensory: Sensation is intact.     Comments: Moving all 4 extremities, sensation intact light touch     ED Results / Procedures / Treatments   Labs (all labs ordered are listed, but only abnormal results are displayed) Labs Reviewed  URINALYSIS, W/ REFLEX TO CULTURE (INFECTION SUSPECTED)    EKG None  Radiology DG Chest Portable 1 View  Result Date: 08/31/2022 CLINICAL DATA:  Cough EXAM: PORTABLE CHEST - 1 VIEW COMPARISON:  04/07/2021 FINDINGS: Coarse pulmonary interstitial markings as before without confluent airspace disease or overt edema. Heart size and mediastinal contours are within normal limits. No effusion.  Vertebral endplate spurring at multiple levels in the mid and lower thoracic spine. Left upper quadrant embolization coils are partially visualized. IMPRESSION: No acute cardiopulmonary disease. Electronically Signed   By: Corlis Leak M.D.   On: 08/31/2022 19:25    Procedures Procedures    Medications Ordered in ED Medications - No data to display  ED Course/ Medical Decision Making/ A&P                             Medical Decision Making Amount and/or Complexity of Data Reviewed Radiology: ordered.  Risk Prescription drug management.    89 year old female with medical history significant for CAD, hypothyroidism, hypertension, UTIs, advanced dementia residing in a memory care unit who presents in the care of of her daughter from [place] memory care due to roughly 10 days of sinusitis symptoms.  The patient also had an episode of incontinence yesterday and her daughter is worried that she is having the symptoms of urinary tract infection.  She has appeared more fatigued today.  She endorses lot of green discharge from both nares bilaterally and seems to be complaining of sinus pressure.  No fevers or chills.  She her baseline mental status is alert and oriented x 1 and she is close to her baseline although somewhat more somnolent today.  On arrival, the patient was afebrile, borderline tachycardic heart rate 100, BP 145/81, saturating 95% on room air. Concern for developing bacterial sinusitis with roughly 10 days of symptoms for which we will treat with a course of Augmentin.  Additionally concern for possible urinary tract infection.  Slight decline in mental status for which laboratory evaluation was initially ordered.  The patient's daughter declined laboratory evaluation initially as she was a difficult stick, requesting only urinalysis and chest x-ray.  A chest x-ray was performed which was negative for acute cardiac or pulm abnormality with no evidence of pneumothorax or evidence of  pneumonia.  Urinalysis was ordered however the patient was unable to find a urine sample.  The patient's daughter did not want to undergo In-N-Out catheterization to further evaluate.  I explained that without laboratory evaluation, the risk for missed diagnosis was high.  I explained the risk of missed diagnosis to include missed infectious etiology which would require further workup.  The patient's daughter is her healthcare power of attorney and still declined further workup in the emergency department and requested a preference for discharge AGAINST MEDICAL ADVICE.  I explained the risk of missed diagnosis and potential significant impact on morbidity and mortality.  A prescription for Augmentin for her sinusitis was provided.  Final Clinical Impression(s) / ED Diagnoses Final diagnoses:  Confusion  Urinary incontinence, unspecified type  Acute maxillary sinusitis, recurrence not specified    Rx / DC Orders ED  Discharge Orders          Ordered    amoxicillin-clavulanate (AUGMENTIN) 875-125 MG tablet  Every 12 hours        08/31/22 2024              Ernie Avena, MD 08/31/22 2038

## 2022-09-01 ENCOUNTER — Non-Acute Institutional Stay: Payer: Medicare Other | Admitting: Family Medicine

## 2022-09-01 ENCOUNTER — Encounter: Payer: Self-pay | Admitting: Family Medicine

## 2022-09-01 ENCOUNTER — Encounter: Payer: Self-pay | Admitting: Adult Health

## 2022-09-01 VITALS — BP 138/78 | HR 84 | Temp 98.0°F | Resp 20

## 2022-09-01 DIAGNOSIS — R8290 Unspecified abnormal findings in urine: Secondary | ICD-10-CM | POA: Insufficient documentation

## 2022-09-01 DIAGNOSIS — R350 Frequency of micturition: Secondary | ICD-10-CM

## 2022-09-01 DIAGNOSIS — R410 Disorientation, unspecified: Secondary | ICD-10-CM

## 2022-09-01 DIAGNOSIS — F03911 Unspecified dementia, unspecified severity, with agitation: Secondary | ICD-10-CM

## 2022-09-01 DIAGNOSIS — J019 Acute sinusitis, unspecified: Secondary | ICD-10-CM

## 2022-09-01 HISTORY — DX: Acute sinusitis, unspecified: J01.90

## 2022-09-01 NOTE — Progress Notes (Signed)
Therapist, nutritional Palliative Care Consult Note Telephone: [phone]  Fax: [phone]   Date of encounter: 09/01/22 4:31 PM PATIENT NAME: Doris Gray [address] [place] [address]   [phone] (home)  DOB: 17-Jan-1931 MRN: [id number] PRIMARY CARE PROVIDER:    Shirline Frees, NP,  [address] [place] [address] [phone]  REFERRING PROVIDER:   Shirline Frees, NP [address] [place],  [place] [address] [phone]  Emergency Contact:    Contact Information     Name Relation Home Work Doris Gray Daughter   [phone]   Doris Gray [phone]         Health Care Doris Gray   I met face to face with patient in [facility]. Palliative Care was asked to follow this patient by consultation request of Nafziger, Kandee Keen, NP to address advance care planning and complex medical decision making. This is a follow up visit.   CODE STATUS: MOST as of 09/02/21: DNR/DNI with limited additional intervention Use of antibiotics and IV fluids on a case by case, time limited basis No feeding tube.      ASSESSMENT AND / RECOMMENDATIONS:  PPS: 50%  Acute non-recurrent sinusitis  Seen in ED yesterday, CXR negative for any acute cardiopulmonary disease. Started in ED on Augmentin-clavulanate 875 mg-125 mg  2.  Agitation due to Dementia/Delirium with dementia Discussed with PCP-Cory Nafziger by phone and he is in agreement to see if pt improves with Augmentin for sinusitis before changing to new antibiotic. Given pt's agitation, recommended to PCP addition of Risperidone 0.5 mg BID x 4-5 days to allow sinusitis to begin resolving and smooth out agitation. PCP in agreement with short term trial.  3.  Abnormal urine findings/urinary frequency Trace blood and large leukocytes, negative nitrites with rare bacteria. Has had E coli previously with  intermediate sensitivity to Augmentin but pt is not symptomatic other than frequency for UTI.      Follow up Palliative Care Visit:  Palliative Care continuing to follow up by monitoring for changes in appetite, weight, functional and cognitive status for chronic disease progression and management in agreement with patient's stated goals of care. Next visit in 1 weeks or prn.  This visit was coded based on medical decision making (MDM).  Chief Complaint  Pt's daughter Doris Gray requested an acute visit for nasal congestion and increased production of thick green nasal drainage and question of possible UTI.   HISTORY OF PRESENT ILLNESS: Doris Gray is a 89 y.o. year old female with dementia and currently has congestion with thick, green nasal discharge. Daughter had taken the patient to [facility] ED yesterday afternoon when the patient was sick.  She had a CXR which was negative for acute cardiovascular issues.  She was started on 7 day course of Augmentin.  Pt was seen in her room on memory care.  She denies any knowledge of having gone to the ED yesterday or that she has had nasal congestion and/or drainage.  She had just refused to allow the aid to help her change her brief.  Had pt blow her nose as she had large amount of drainage at nare. She continues to insist she is fine, has no issues and is moving her clothes back and forth with no particular purpose noted. She becomes more agitated and asks this provider to "go away".  Pt denies fever, nausea, vomiting, dysuria, facial pressure or jaw pain.    ACTIVITIES OF  DAILY LIVING: CONTINENT OF BLADDER? No CONTINENT OF BOWEL?  Yes BATHING/DRESSING/FEEDING: Requires assistance with clothing selection and prompting for getting dressed  MOBILITY:   INDEPENDENTLY AMBULATORY/AMBULATORY-Yes  APPETITE? good   CURRENT PROBLEM LIST:  Patient Active Problem List   Diagnosis Date Noted   Acute non-recurrent sinusitis 09/01/2022   Abnormal urine  findings 09/01/2022   Chest pain 05/06/2022   Left upper arm pain 05/06/2022   Delirium with dementia 05/06/2022   Urinary frequency 09/05/2021   Arthralgia of multiple sites, bilateral 09/05/2021   NAFL (nonalcoholic fatty liver) 06/10/2021   Moderate dementia 06/10/2021   Palliative care encounter 06/10/2021   Vitamin B12 deficiency 06/10/2021   Splenic artery aneurysm    Hypertension    Hypothyroidism    Melanoma    PAST MEDICAL HISTORY:  Active Ambulatory Problems    Diagnosis Date Noted   Splenic artery aneurysm    Hypertension    Hypothyroidism    Melanoma    NAFL (nonalcoholic fatty liver) 06/10/2021   Moderate dementia 06/10/2021   Palliative care encounter 06/10/2021   Vitamin B12 deficiency 06/10/2021   Urinary frequency 09/05/2021   Arthralgia of multiple sites, bilateral 09/05/2021   Chest pain 05/06/2022   Left upper arm pain 05/06/2022   Delirium with dementia 05/06/2022   Acute non-recurrent sinusitis 09/01/2022   Abnormal urine findings 09/01/2022   Resolved Ambulatory Problems    Diagnosis Date Noted   UTI (urinary tract infection) 04/07/2021   Acute encephalopathy 04/07/2021   Acute cystitis without hematuria 04/07/2021   Influenza A 04/07/2021   Past Medical History:  Diagnosis Date   CAD (coronary artery disease)    Chicken pox    Colon polyps    Emphysema of lung    History of thyroid surgery    Nodule of right lung    SOCIAL HX:  Social History   Tobacco Use   Smoking status: Former   Smokeless tobacco: Never  Substance Use Topics   Alcohol use: Not Currently    Comment: Glass of wine occasionally   FAMILY HX:  Family History  Adopted: Yes       Preferred Pharmacy: ALLERGIES:  Allergies  Allergen Reactions   Amlodipine Other (See Comments)    Pt denies    Codeine    Statins     Myalgia    Sulfa Antibiotics    Sulfamethoxazole-Trimethoprim Diarrhea    AKA Bactrim   Sulfasalazine Other (See Comments) and Diarrhea    Tetanus Toxoid Swelling    Had arm swelling  Pt denies     PERTINENT MEDICATIONS:  Outpatient Encounter Medications as of 09/01/2022  Medication Sig   risperiDONE (RISPERDAL) 0.5 MG tablet Take 0.5 mg by mouth 2 (two) times daily.   amoxicillin-clavulanate (AUGMENTIN) 875-125 MG tablet Take 1 tablet by mouth every 12 (twelve) hours.   ARIPiprazole (ABILIFY) 2 MG tablet TAKE ONE TABLET BY MOUTH AT BEDTIME FOR MOOD (Patient taking differently: Take 5 mg by mouth at bedtime.)   fexofenadine (ALLEGRA) 180 MG tablet Take 180 mg by mouth daily.   levothyroxine (SYNTHROID) 137 MCG tablet Take 1 tablet (137 mcg total) by mouth every Monday, Wednesday, and Friday. (Patient taking differently: Take 137 mcg by mouth daily.)   losartan (COZAAR) 50 MG tablet Take 1 tablet (50 mg total) by mouth daily.   No facility-administered encounter medications on file as of 09/01/2022.    History obtained from review of EMR, discussion  with family, facility staff/caregiver and/or patient.   Urinalysis  Component Value Date/Time   COLORURINE YELLOW 08/31/2022 2056   APPEARANCEUR CLEAR 08/31/2022 2056   LABSPEC 1.022 08/31/2022 2056   PHURINE 6.0 08/31/2022 2056   GLUCOSEU NEGATIVE 08/31/2022 2056   HGBUR TRACE (A) 08/31/2022 2056   BILIRUBINUR NEGATIVE 08/31/2022 2056   BILIRUBINUR neg 04/09/2022 1606   KETONESUR NEGATIVE 08/31/2022 2056   PROTEINUR NEGATIVE 08/31/2022 2056   UROBILINOGEN 0.2 04/09/2022 1606   NITRITE NEGATIVE 08/31/2022 2056   LEUKOCYTESUR LARGE (A) 08/31/2022 2056     I reviewed available labs, medications, imaging, studies and related documents from the EMR.  Records reviewed and summarized above.   Physical Exam: GENERAL: NAD, appears flushed EENT: thick, yellow green d/c noted at left nare LUNGS: CTAB, no increased work of breathing, room air CARDIAC:  S1S2, RRR with LUSB murmur, 2+ BLE edema, No cyanosis ABD:  Normo-active BS x 4 quads, soft, non-tender.  No  CVAT EXTREMITIES: Normal ROM, no deformity, strength equal, No muscle atrophy/subcutaneous fat loss NEURO:  No weakness, noted cognitive impairment,  PSYCH:  Psychomotor agitation  Thank you for the opportunity to participate in the care of [employer]. Please call our main office at [phone] if we can be of additional assistance.    Joycelyn Man FNP-C  [email or web address] .[facility]  Phone:  [phone]

## 2022-09-10 ENCOUNTER — Encounter: Payer: Self-pay | Admitting: Family Medicine

## 2022-09-10 DIAGNOSIS — R600 Localized edema: Secondary | ICD-10-CM | POA: Insufficient documentation

## 2022-09-13 ENCOUNTER — Non-Acute Institutional Stay: Payer: Medicare Other | Admitting: Family Medicine

## 2022-09-13 ENCOUNTER — Encounter: Payer: Self-pay | Admitting: Family Medicine

## 2022-09-13 VITALS — BP 124/80 | HR 67 | Temp 97.7°F | Resp 20

## 2022-09-13 DIAGNOSIS — J019 Acute sinusitis, unspecified: Secondary | ICD-10-CM

## 2022-09-13 DIAGNOSIS — R410 Disorientation, unspecified: Secondary | ICD-10-CM

## 2022-09-13 DIAGNOSIS — Z515 Encounter for palliative care: Secondary | ICD-10-CM

## 2022-09-13 NOTE — Progress Notes (Signed)
Therapist, nutritional Palliative Care Consult Note Telephone: [phone]  Fax: [phone]   Date of encounter: 09/13/22 4:19 PM PATIENT NAME: Doris Gray [address] [place] [address]   [phone] (home)  DOB: 05-08-1931 MRN: [id number] PRIMARY CARE PROVIDER:    Shirline Frees, NP,  [address] [place] [address] [phone]  REFERRING PROVIDER:   Shirline Frees, NP [address] [place],  [place] [address] [phone]  Emergency Contact:    Contact Information     Name Relation Home Work [place] Daughter   [phone]   Blanchie Serve [phone]         Health Care Lynden Ang, son and [facility] POA-Peter Elnita Maxwell   I met face to face with patient  and son/HC POA-Peter Corporate investment banker in [employer]. Palliative Care was asked to follow this patient by consultation request of Nafziger, Kandee Keen, NP to address advance care planning and complex medical decision making. This is a follow up visit.   CODE STATUS: MOST as of 09/02/21, recompleted with changes 09/13/22: DNR/DNI with comfort measures Use of antibiotics and IV fluids on a case by case, time limited basis Feeding tube on a time limited basis.      ASSESSMENT AND / RECOMMENDATIONS:  PPS: 50% Palliative Care Encounter Completed new MOST form per son and [facility] POA request. Son will post form in pt's room where she cannot reach it and second original DNR and MOST form will be posted on pt's chart.   Acute non-recurrent sinusitis  Resolved, completed Augmentin-clavulanate 875 mg-125 mg, started in ED on 09/03/22  3.  Agitation due to Dementia/Delirium with dementia Currently able to be redirected, sinusitis resolved. Had temporary use of Risperdal with acute delirium with sinusitis until pt could complete antibiotics. Will not extend use of Risperdal at present unless pt begins to show more  agitation.      Follow up Palliative Care Visit:  Palliative Care continuing to follow up by monitoring for changes in appetite, weight, functional and cognitive status for chronic disease progression and management in agreement with patient's stated goals of care. Next visit in 3-4 weeks or prn.  This visit was coded based on medical decision making (MDM).  Chief Complaint  Had appointment to meet with pt's son and [facility] POA-Peter Flis to re-complete DNR and MOST form as pt destroyed originals in her memory care unit.  Am also following up recent treatment for sinusitis.  HISTORY OF PRESENT ILLNESS: Doris Gray is a 89 y.o. year old female with dementia who had  congestion with thick, green nasal discharge seen in ED with negative CXR and started on course of Augmentin. Pt reports "I feel fine" Sheis seen today visiting with her son and [facility] POA -Margeurite Gasparian who states she has been having some loose stools since being on the antibiotic.  She is smiling today, making eye contact and able to stay on topic today and be redirected.  She has completed her 7 day course of Augmentin, continues to have small amount of congestion but no significant drainage, cough or SOB.  ACTIVITIES OF DAILY LIVING: CONTINENT OF BLADDER? No CONTINENT OF BOWEL?  Yes BATHING/DRESSING/FEEDING: Requires assistance with clothing selection and prompting for getting dressed  MOBILITY:   INDEPENDENTLY AMBULATORY/AMBULATORY-Yes  APPETITE? good   CURRENT PROBLEM LIST:  Patient Active Problem List   Diagnosis Date Noted   Localized edema 09/10/2022   Acute non-recurrent sinusitis 09/01/2022  Abnormal urine findings 09/01/2022   Agitation due to dementia [facility]) 09/01/2022   Chest pain 05/06/2022   Left upper arm pain 05/06/2022   Delirium with dementia 05/06/2022   Urinary frequency 09/05/2021   Arthralgia of multiple sites, bilateral 09/05/2021   NAFL (nonalcoholic fatty liver) 06/10/2021   Moderate  dementia ([name]) 06/10/2021   Palliative care encounter 06/10/2021   Vitamin B12 deficiency 06/10/2021   Splenic artery aneurysm ([name])    Hypertension    Hypothyroidism    Melanoma ([name])    PAST MEDICAL HISTORY:  Active Ambulatory Problems    Diagnosis Date Noted   Splenic artery aneurysm ([name])    Hypertension    Hypothyroidism    Melanoma ([name])    NAFL (nonalcoholic fatty liver) 06/10/2021   Moderate dementia ([name]) 06/10/2021   Palliative care encounter 06/10/2021   Vitamin B12 deficiency 06/10/2021   Urinary frequency 09/05/2021   Arthralgia of multiple sites, bilateral 09/05/2021   Chest pain 05/06/2022   Left upper arm pain 05/06/2022   Delirium with dementia 05/06/2022   Acute non-recurrent sinusitis 09/01/2022   Abnormal urine findings 09/01/2022   Agitation due to dementia ([name]) 09/01/2022   Localized edema 09/10/2022   Resolved Ambulatory Problems    Diagnosis Date Noted   UTI (urinary tract infection) 04/07/2021   Acute encephalopathy 04/07/2021   Acute cystitis without hematuria 04/07/2021   Influenza A 04/07/2021   Past Medical History:  Diagnosis Date   CAD (coronary artery disease)    Chicken pox    Colon polyps    Emphysema of lung ([name])    History of thyroid surgery    Nodule of right lung    SOCIAL HX:  Social History   Tobacco Use   Smoking status: Former   Smokeless tobacco: Never  Substance Use Topics   Alcohol use: Not Currently    Comment: Glass of wine occasionally   FAMILY HX:  Family History  Adopted: Yes       Preferred Pharmacy: ALLERGIES:  Allergies  Allergen Reactions   Amlodipine Other (See Comments)    Pt denies    Codeine    Statins     Myalgia    Sulfa Antibiotics    Sulfamethoxazole-Trimethoprim Diarrhea    AKA Bactrim   Sulfasalazine Other (See Comments) and Diarrhea   Tetanus Toxoid Swelling    Had arm swelling  Pt denies     PERTINENT MEDICATIONS:  Outpatient Encounter Medications as of 09/13/2022   Medication Sig   amoxicillin-clavulanate (AUGMENTIN) 875-125 MG tablet Take 1 tablet by mouth every 12 (twelve) hours.   ARIPiprazole (ABILIFY) 2 MG tablet TAKE ONE TABLET BY MOUTH AT BEDTIME FOR MOOD (Patient taking differently: Take 5 mg by mouth at bedtime.)   fexofenadine (ALLEGRA) 180 MG tablet Take 180 mg by mouth daily.   levothyroxine (SYNTHROID) 137 MCG tablet Take 1 tablet (137 mcg total) by mouth every Monday, Wednesday, and Friday. (Patient taking differently: Take 137 mcg by mouth daily.)   losartan (COZAAR) 50 MG tablet Take 1 tablet (50 mg total) by mouth daily.   risperiDONE (RISPERDAL) 0.5 MG tablet Take 0.5 mg by mouth 2 (two) times daily.   No facility-administered encounter medications on file as of 09/13/2022.    History obtained from review of EMR,discussion  with family, facility staff/caregiver and/or patient.     I reviewed available labs, medications, imaging, studies and related documents from the EMR.  There are no new records since last visit.   Physical Exam:  GENERAL: NAD EENT: mildly flat nasal tone when talking, No nasal d/c  LUNGS: CTAB, no increased work of breathing, room air CARDIAC:  S1S2, RRR with LUSB murmur, 2+ non-pitting BLE edema, No cyanosis ABD:  Normo-active BS x 4 quads, soft, non-tender.  EXTREMITIES: Normal ROM, no deformity, strength equal, No muscle atrophy/subcutaneous fat loss NEURO:  No weakness, noted cognitive impairment,  PSYCH:  Pleasant and cooperative with assessment  Thank you for the opportunity to participate in the care of [employer]. Please call our main office at [phone] if we can be of additional assistance.    Joycelyn Man FNP-C  [email or web address] .[facility]  Phone:  [phone]

## 2022-09-15 ENCOUNTER — Telehealth: Payer: Self-pay | Admitting: Adult Health

## 2022-09-15 NOTE — Telephone Encounter (Signed)
Noted  

## 2022-09-15 NOTE — Telephone Encounter (Signed)
Patient dropped off document  [employer] Form , to be filled out by provider. Patient requested to send it via Fax within 5-days. Document is located in providers tray at front office.Please advise at Mobile [phone] (mobile)

## 2022-09-21 NOTE — Telephone Encounter (Signed)
Form faxed and placed in front office filing cabinet.

## 2022-09-25 IMAGING — CT CT HEAD W/O CM
4 series · 16 of 47 positions shown, 18 images · non-contrast
Comparison: None.

CLINICAL DATA: Mental status change of unknown etiology.

EXAM:
CT HEAD WITHOUT CONTRAST
TECHNIQUE: Contiguous axial images were obtained from the base of the skull
through the vertex without intravenous contrast.

[Series 2: head bone · axial · 0.45mm/px · z∈[-96,-62]mm · 3 of 87 slices shown]
[im 9/87  bone]
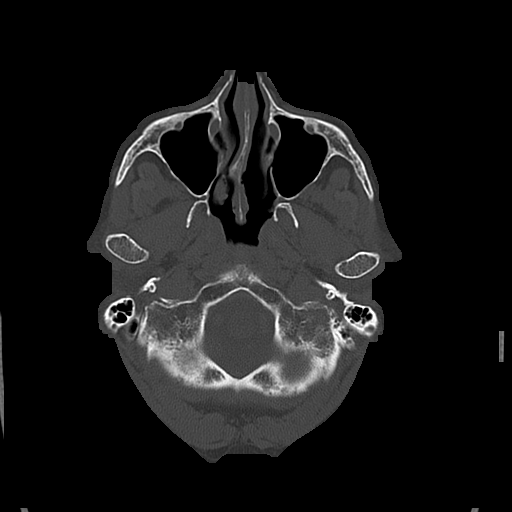
[im 18/87  bone]
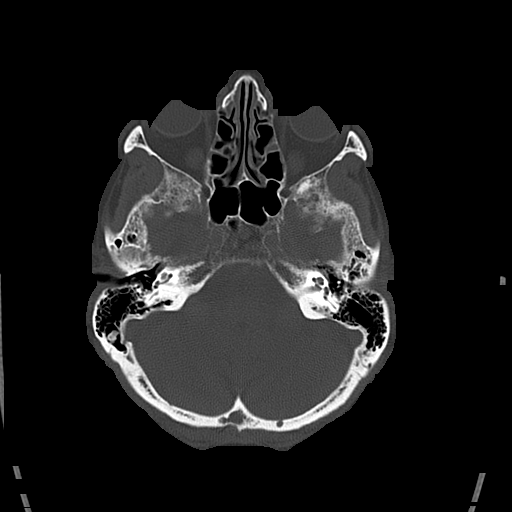
[im 26/87  bone]
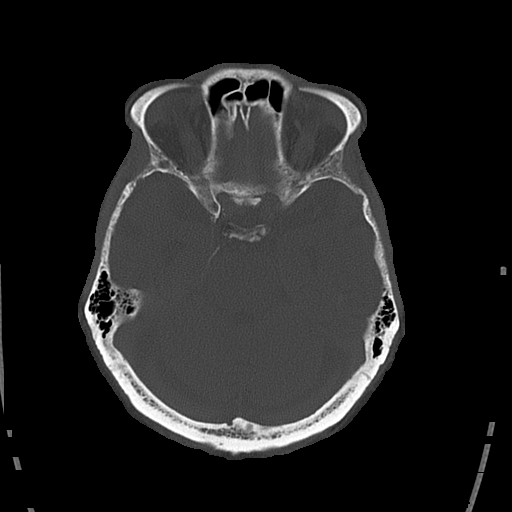

[Series 3: head wo · axial · 0.45mm/px · z∈[-92,+33]mm · 7 of 35 slices shown, 9 images]
[im 5/35  brain]
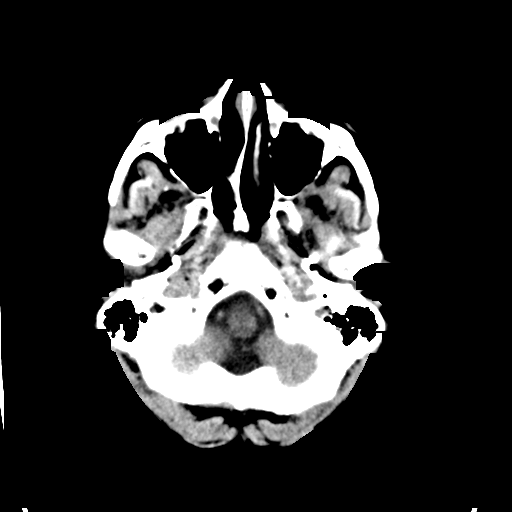
[im 5/35  bone]
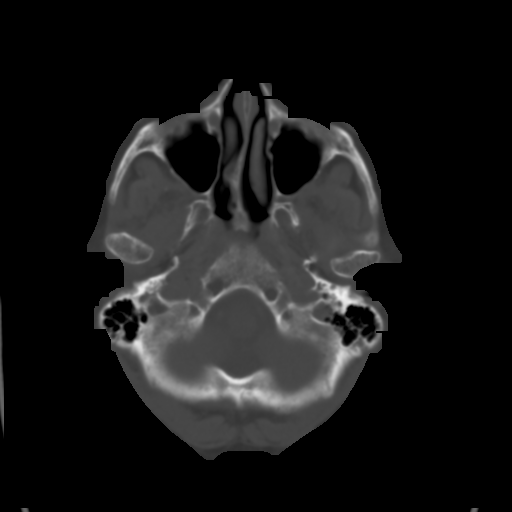
[im 9/35  brain]
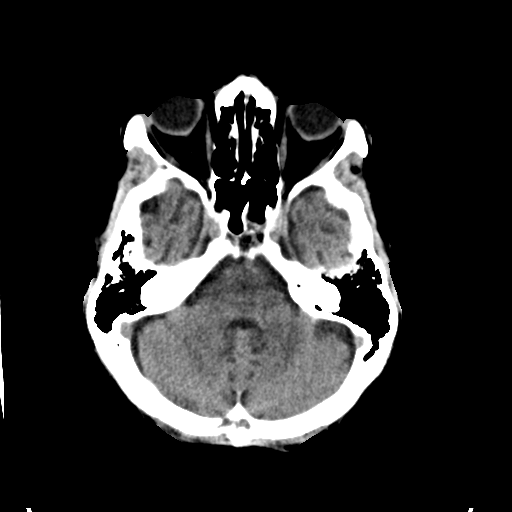
[im 13/35  brain]
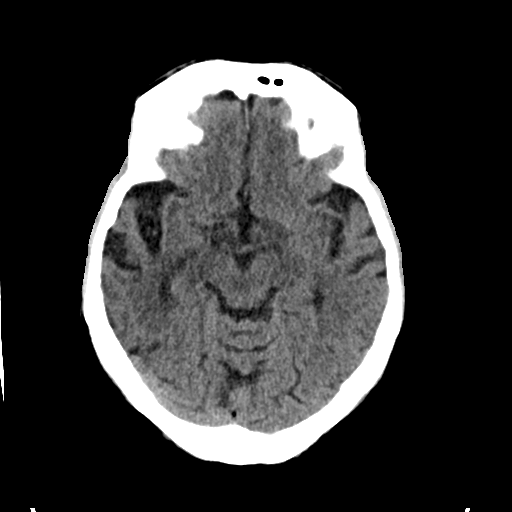
[im 18/35  brain]
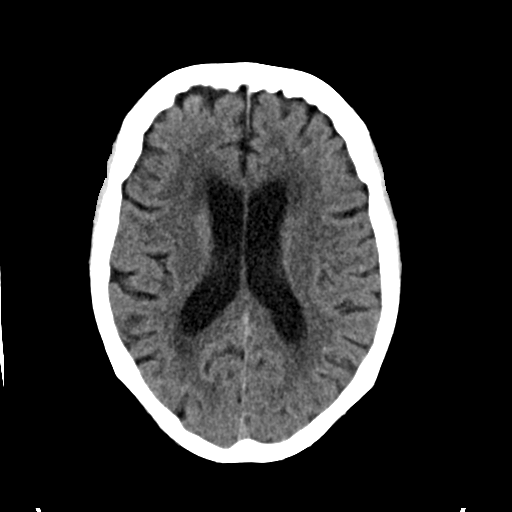
[im 22/35  brain]
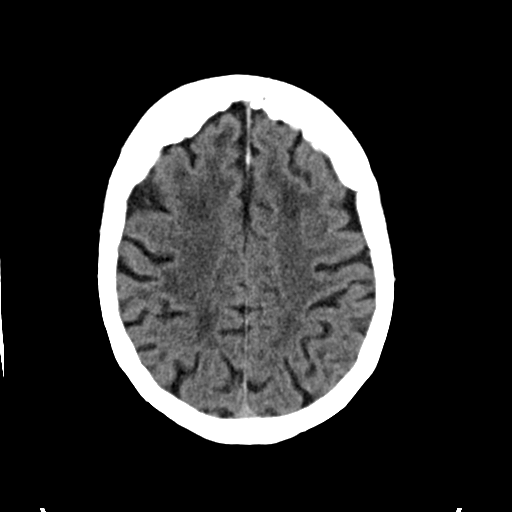
[im 22/35  bone]
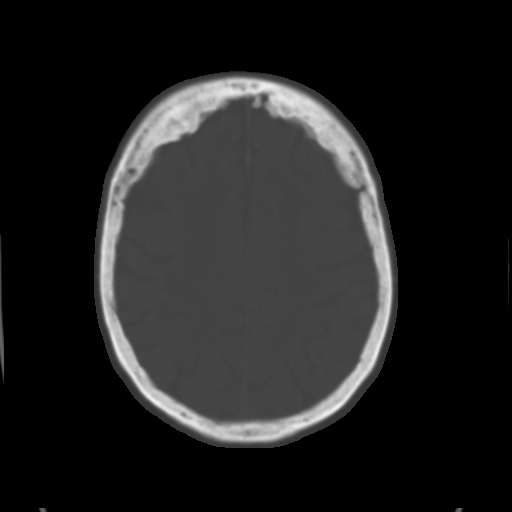
[im 26/35  brain]
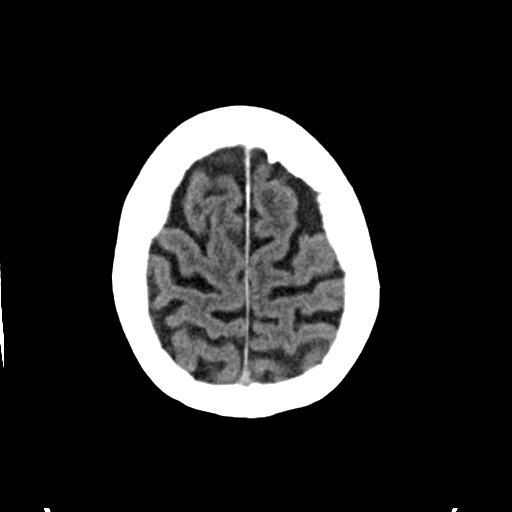
[im 30/35  brain]
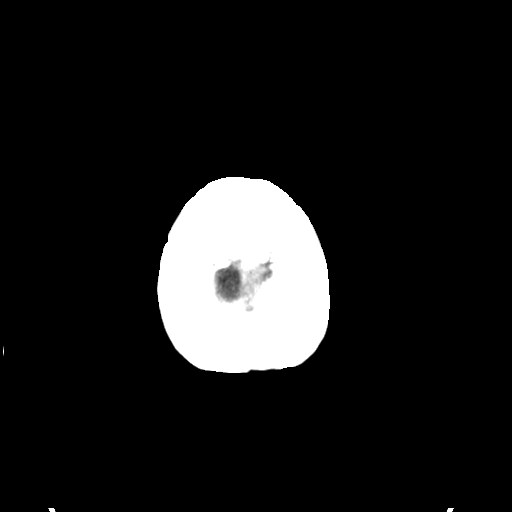

[Series 4: coronal soft · coronal · 0.32mm/px · 3 of 69 slices shown]
[im 23/69  brain]
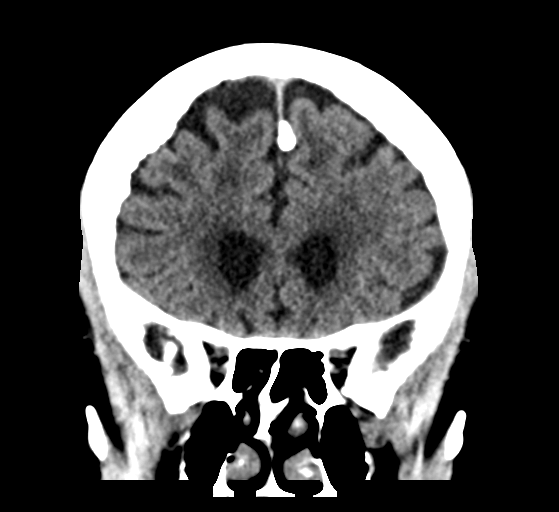
[im 31/69  brain]
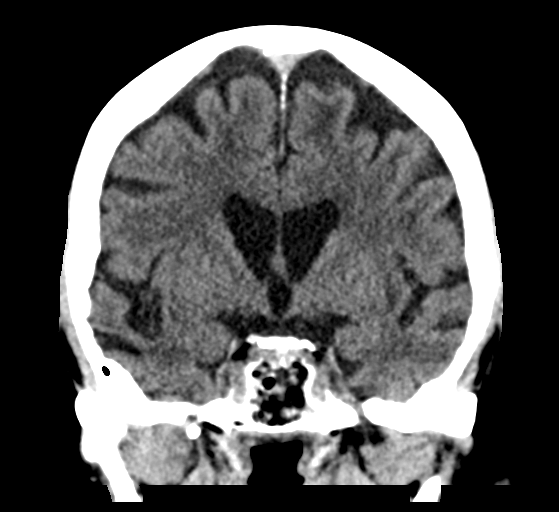
[im 38/69  brain]
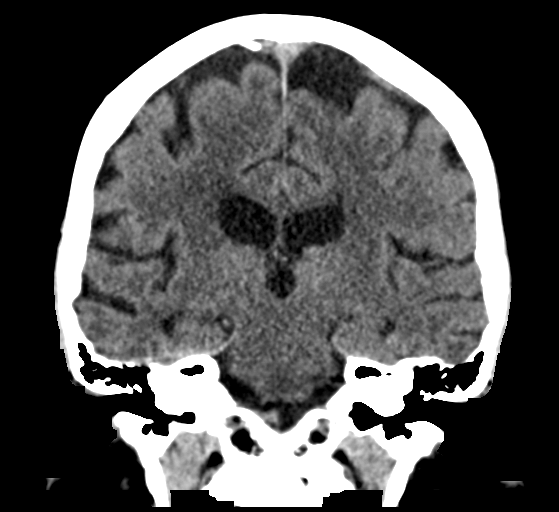

[Series 5: sagittal soft · sagittal · 0.32mm/px · 3 of 57 slices shown]
[im 19/57  brain]
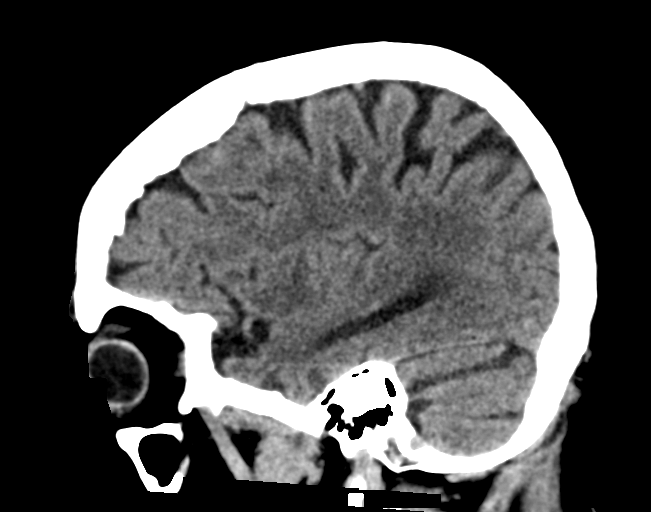
[im 29/57  brain]
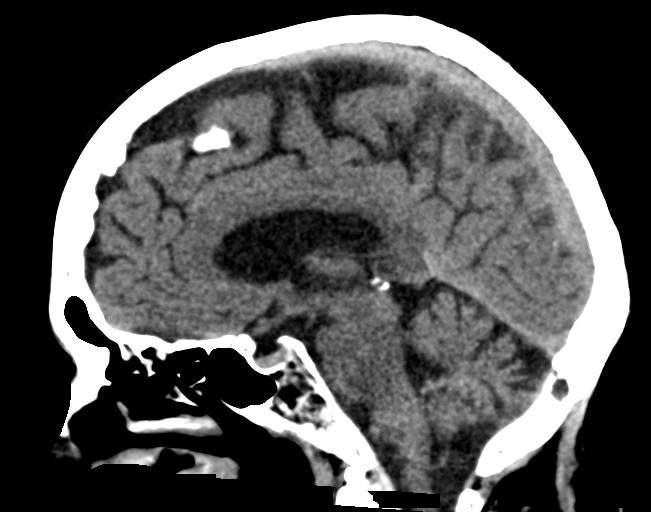
[im 38/57  brain]
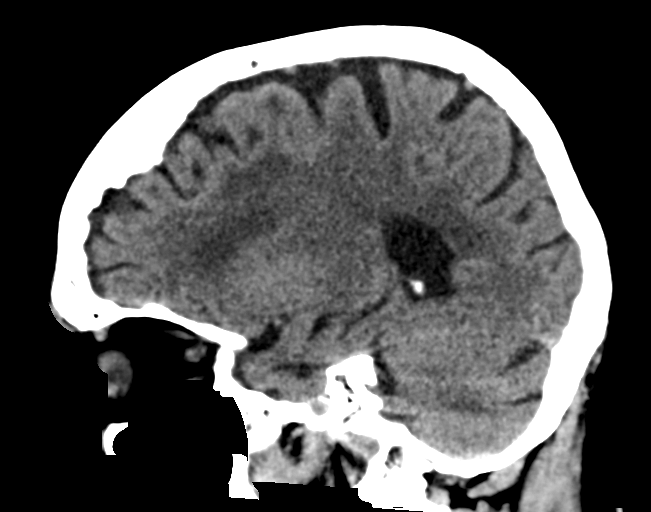

[16 of 47 positions shown; findings below may reference images not displayed]

FINDINGS: Brain: No evidence of acute infarction, hemorrhage, hydrocephalus,
extra-axial collection or mass lesion/mass effect. There is moderate
to severe diffuse low-attenuation within the subcortical and
periventricular white matter compatible with chronic microvascular
disease. Prominence of sulci and ventricles compatible with brain
atrophy.

Vascular: No hyperdense vessel or unexpected calcification.

Skull: Normal. Negative for fracture or focal lesion.

Sinuses/Orbits: No acute finding.

Other: None
IMPRESSION: 1. No acute intracranial abnormalities.
2. Chronic small vessel ischemic change and brain atrophy.

## 2022-10-07 ENCOUNTER — Encounter: Payer: Medicare Other | Admitting: Family Medicine

## 2022-10-22 ENCOUNTER — Telehealth: Payer: Self-pay | Admitting: Family Medicine

## 2022-10-22 NOTE — Telephone Encounter (Signed)
Doris Gray, Facility provider for [facility], contacted to alert to following pt as Palliative Provider at former facility. Requested order to continue to follow pt on Palliative Service. Verbal order received to continue service with patient.  Joycelyn Man FNP-C

## 2023-05-31 ENCOUNTER — Other Ambulatory Visit: Payer: Self-pay | Admitting: Internal Medicine

## 2023-05-31 MED ORDER — PERMETHRIN 5 % EX CREA: 1.0000 | TOPICAL_CREAM | Freq: Once | CUTANEOUS | 0 refills | Status: AC

## 2023-06-30 ENCOUNTER — Emergency Department (HOSPITAL_COMMUNITY): Payer: Medicare Other

## 2023-06-30 ENCOUNTER — Emergency Department (HOSPITAL_COMMUNITY)
Admission: EM | Admit: 2023-06-30 | Discharge: 2023-06-30 | Disposition: A | Discharge status: Home / Self Care | Payer: Medicare Other | Attending: Student | Admitting: Student

## 2023-06-30 ENCOUNTER — Other Ambulatory Visit: Payer: Self-pay

## 2023-06-30 DIAGNOSIS — Z87891 Personal history of nicotine dependence: Secondary | ICD-10-CM | POA: Insufficient documentation

## 2023-06-30 DIAGNOSIS — I10 Essential (primary) hypertension: Secondary | ICD-10-CM | POA: Diagnosis not present

## 2023-06-30 DIAGNOSIS — E039 Hypothyroidism, unspecified: Secondary | ICD-10-CM | POA: Insufficient documentation

## 2023-06-30 DIAGNOSIS — W19XXXA Unspecified fall, initial encounter: Secondary | ICD-10-CM | POA: Insufficient documentation

## 2023-06-30 DIAGNOSIS — I2510 Atherosclerotic heart disease of native coronary artery without angina pectoris: Secondary | ICD-10-CM | POA: Diagnosis not present

## 2023-06-30 DIAGNOSIS — Z79899 Other long term (current) drug therapy: Secondary | ICD-10-CM | POA: Insufficient documentation

## 2023-06-30 DIAGNOSIS — J168 Pneumonia due to other specified infectious organisms: Secondary | ICD-10-CM | POA: Diagnosis not present

## 2023-06-30 DIAGNOSIS — M25511 Pain in right shoulder: Secondary | ICD-10-CM | POA: Insufficient documentation

## 2023-06-30 DIAGNOSIS — D72829 Elevated white blood cell count, unspecified: Secondary | ICD-10-CM | POA: Diagnosis not present

## 2023-06-30 DIAGNOSIS — F0390 Unspecified dementia without behavioral disturbance: Secondary | ICD-10-CM | POA: Diagnosis not present

## 2023-06-30 DIAGNOSIS — J189 Pneumonia, unspecified organism: Secondary | ICD-10-CM

## 2023-06-30 LAB — COMPREHENSIVE METABOLIC PANEL
ALT: 33 U/L (ref 0–44)
AST: 34 U/L (ref 15–41)
Albumin: 3.9 g/dL (ref 3.5–5.0)
Alkaline Phosphatase: 74 U/L (ref 38–126)
Anion gap: 12 (ref 5–15)
BUN: 19 mg/dL (ref 8–23)
CO2: 20 mmol/L — ABNORMAL LOW (ref 22–32)
Calcium: 9.9 mg/dL (ref 8.9–10.3)
Chloride: 104 mmol/L (ref 98–111)
Creatinine, Ser: 0.74 mg/dL (ref 0.44–1.00)
GFR, Estimated: >60 mL/min (ref >60)
Glucose, Bld: 156 mg/dL — ABNORMAL HIGH (ref 70–99)
Potassium: 3.7 mmol/L (ref 3.5–5.1)
Sodium: 136 mmol/L (ref 135–145)
Total Bilirubin: 1 mg/dL (ref 0.0–1.2)
Total Protein: 7.4 g/dL (ref 6.5–8.1)

## 2023-06-30 LAB — CBC WITH DIFFERENTIAL/PLATELET
Abs Immature Granulocytes: 0.07 10*3/uL (ref 0.00–0.07)
Basophils Absolute: 0 10*3/uL (ref 0.0–0.1)
Basophils Relative: 0 %
Eosinophils Absolute: 0 10*3/uL (ref 0.0–0.5)
Eosinophils Relative: 0 %
HCT: 40.1 % (ref 36.0–46.0)
Hemoglobin: 12.9 g/dL (ref 12.0–15.0)
Immature Granulocytes: 1 %
Lymphocytes Relative: 9 %
Lymphs Abs: 1.1 10*3/uL (ref 0.7–4.0)
MCH: 29.3 pg (ref 26.0–34.0)
MCHC: 32.2 g/dL (ref 30.0–36.0)
MCV: 91.1 fL (ref 80.0–100.0)
Monocytes Absolute: 1.1 10*3/uL — ABNORMAL HIGH (ref 0.1–1.0)
Monocytes Relative: 9 %
Neutro Abs: 9 10*3/uL — ABNORMAL HIGH (ref 1.7–7.7)
Neutrophils Relative %: 81 %
Platelets: 240 10*3/uL (ref 150–400)
RBC: 4.4 MIL/uL (ref 3.87–5.11)
RDW: 14.4 % (ref 11.5–15.5)
WBC: 11.2 10*3/uL — ABNORMAL HIGH (ref 4.0–10.5)
nRBC: 0 % (ref 0.0–0.2)

## 2023-06-30 MED ORDER — DOXYCYCLINE HYCLATE 100 MG PO CAPS: 100.0000 mg | ORAL_CAPSULE | Freq: Two times a day (BID) | ORAL | 0 refills | Status: AC

## 2023-06-30 MED ORDER — AMOXICILLIN-POT CLAVULANATE 875-125 MG PO TABS: 1.0000 | ORAL_TABLET | Freq: Two times a day (BID) | ORAL | 0 refills | Status: AC

## 2023-06-30 MED ORDER — DOXYCYCLINE HYCLATE 100 MG PO TABS
100.0000 mg | ORAL_TABLET | Freq: Once | ORAL | Status: AC
  Administered 2023-06-30: 100 mg via ORAL
  Filled 2023-06-30: qty 1

## 2023-06-30 MED ORDER — AMOXICILLIN-POT CLAVULANATE 875-125 MG PO TABS
1.0000 | ORAL_TABLET | Freq: Once | ORAL | Status: AC
Start: 2023-06-30 — End: 2023-06-30
  Administered 2023-06-30: 1 via ORAL
  Filled 2023-06-30: qty 1

## 2023-06-30 NOTE — ED Provider Notes (Signed)
Received signout from Dr. Posey Rea; Presented for fall. Trauma workup negative, but pneumonia on chest x-ray. Dispo Pending labs.  Discharge home if labs unremarkable.  Labs with mild leukocytosis otherwise reassuring no metabolic derangement.  No elevation in her BUN or creatinine.  No transaminitis to suggest hepatobiliary disease.  Low risk based on curb 65 score for pneumonia.  She is not requiring oxygen and does not appear to be in distress.  Will discharge with antibiotics.    Coral Spikes, DO 06/30/23 [phone]

## 2023-06-30 NOTE — Discharge Instructions (Addendum)
You have pneumonia.  We are prescribing antibiotics.  Please take them as prescribed for the full course.  Return if you develop fevers, chills, chest pain, shortness of breath, passout, lethargy, altered mental status or any new or worsening symptoms that are concerning to you.

## 2023-06-30 NOTE — ED Notes (Signed)
Alert, NAD, calm, interactive, jovial, pleasantly confused and content. Daughter arrives to [facility].

## 2023-06-30 NOTE — ED Provider Notes (Signed)
[place] EMERGENCY DEPARTMENT AT [facility] Provider Note  CSN: [id number] Arrival date & time: 06/30/23 4010  Chief Complaint(s) Shoulder Pain (Patient brought in my [facility] per EMS patient c/o right shoulder pain after a unwitnessed fall. Facility found her about 0400 this morning on the floor on side of her bed. Hx dementia )  HPI Doris Gray is a 89 y.o. female with PMH dementia, emphysema, hypothyroidism who presents emergency room for evaluation of right shoulder pain after an unwitnessed fall.  Patient found on the floor at the side of her bed by nursing facility staff.  Patient is demented and unable to give further history.   Past Medical History Past Medical History:  Diagnosis Date   Acute cystitis without hematuria 04/07/2021   Acute encephalopathy 04/07/2021   Acute non-recurrent sinusitis 09/01/2022   CAD (coronary artery disease)    Chicken pox    Colon polyps    Emphysema of lung ([name])    History of thyroid surgery    Hypertension    Hypothyroidism    Influenza A 04/07/2021   Melanoma ([name])    Nodule of right lung    calcified granuloma 2.6 mm on CT from 2017    Splenic artery aneurysm ([name])    2.6 cm    UTI (urinary tract infection) 04/07/2021   Patient Active Problem List   Diagnosis Date Noted   Localized edema 09/10/2022   Acute non-recurrent sinusitis 09/01/2022   Abnormal urine findings 09/01/2022   Agitation due to dementia ([name]) 09/01/2022   Chest pain 05/06/2022   Left upper arm pain 05/06/2022   Delirium with dementia ([name]) 05/06/2022   Urinary frequency 09/05/2021   Arthralgia of multiple sites, bilateral 09/05/2021   NAFL (nonalcoholic fatty liver) 06/10/2021   Moderate dementia ([name]) 06/10/2021   Palliative care encounter 06/10/2021   Vitamin B12 deficiency 06/10/2021   Splenic artery aneurysm ([name])    Hypertension    Hypothyroidism    Melanoma ([name])    Home Medication(s) Prior to Admission medications    Medication Sig Start Date End Date Taking? Authorizing Provider  amoxicillin-clavulanate (AUGMENTIN) 875-125 MG tablet Take 1 tablet by mouth every 12 (twelve) hours. 08/31/22   Ernie Avena, MD  ARIPiprazole (ABILIFY) 2 MG tablet TAKE ONE TABLET BY MOUTH AT BEDTIME FOR MOOD Patient taking differently: Take 5 mg by mouth at bedtime. 06/15/22   Nafziger, Kandee Keen, NP  fexofenadine (ALLEGRA) 180 MG tablet Take 180 mg by mouth daily.    [provider]  levothyroxine (SYNTHROID) 137 MCG tablet Take 1 tablet (137 mcg total) by mouth every Monday, Wednesday, and Friday. Patient taking differently: Take 137 mcg by mouth daily. 04/15/21   Kathlen Mody, MD  losartan (COZAAR) 50 MG tablet Take 1 tablet (50 mg total) by mouth daily. 05/23/20   Nafziger, Kandee Keen, NP  risperiDONE (RISPERDAL) 0.5 MG tablet Take 0.5 mg by mouth 2 (two) times daily. 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   THYROID SURGERY     TONSILLECTOMY AND ADENOIDECTOMY     Family History Family History  Adopted: Yes    Social History Social History   Tobacco Use   Smoking status: Former   Smokeless tobacco: Never  Advertising account planner   Vaping status: Never Used  Substance Use Topics   Alcohol use: Not Currently    Comment: Glass of wine occasionally   Drug use: Never   Allergies Amlodipine, Codeine, Statins, Sulfa antibiotics, Sulfamethoxazole-trimethoprim, Sulfasalazine, and Tetanus toxoid  Review of Systems Review of Systems  Unable to perform ROS: Dementia    Physical Exam Vital Signs  I have reviewed the triage vital signs BP (!) 186/146 (BP Location: Left Arm)   Pulse 84   Temp 98.5 F (36.9 C) (Axillary)   Resp 17   SpO2 94%   Physical  Exam Vitals and nursing note reviewed.  Constitutional:      General: She is not in acute distress.    Appearance: She is well-developed.  HENT:     Head: Normocephalic and atraumatic.  Eyes:     Conjunctiva/sclera: Conjunctivae normal.  Cardiovascular:     Rate and Rhythm: Normal rate and regular rhythm.     Heart sounds: No murmur heard. Pulmonary:     Effort: Pulmonary effort is normal. No respiratory distress.     Breath sounds: Normal breath sounds.  Abdominal:     Palpations: Abdomen is soft.     Tenderness: There is no abdominal tenderness.  Musculoskeletal:        General: Tenderness present. No swelling.     Cervical back: Neck supple.  Skin:    General: Skin is warm and dry.     Capillary Refill: Capillary refill takes less than 2 seconds.  Neurological:     Mental Status: She is alert.  Psychiatric:        Mood and Affect: Mood normal.     ED Results and Treatments Labs (all labs ordered are listed, but only abnormal results are displayed) Labs Reviewed - No data to display                                                                                                                        Radiology No results found.  Pertinent labs & imaging results that were available during my care of the patient were reviewed by me and considered in my medical decision making (see MDM for details).  Medications Ordered in ED Medications - No data to display  Procedures Procedures  (including critical care time)  Medical Decision Making / ED Course   This patient presents to the ED for concern of fall, this involves an extensive number of treatment options, and is a complaint that carries with it a high risk of complications and morbidity.  The differential diagnosis includes fracture, contusion, hematoma, ligamentous injury, closed head  injury, ICH, laceration, intrathoracic injury, intra-abdominal injury   MDM: Who patient seen in the emergency room for evaluation of an unwitnessed fall with shoulder pain.  Physical exam with tenderness at the right shoulder, rales at the right base but is otherwise unremarkable.  Trauma imaging including CT head and C-spine, chest x-ray and shoulder x-ray negative for acute traumatic injury but does show right-sided pneumonia.  Augmentin Doxy ordered for the patient.  She is not hypoxic or in respiratory distress.  Pending laboratory evaluation at time of signout.  Please see provider signout note for continuation of workup.   Additional history obtained:  -External records from outside source obtained and reviewed including: Chart review including previous notes, labs, imaging, consultation notes   Lab Tests: -I ordered, reviewed, and interpreted labs.   The pertinent results include:   Labs Reviewed - No data to display    Imaging Studies ordered: I ordered imaging studies including chest x-ray, shoulder x-ray, CT head and C-spine I independently visualized and interpreted imaging. I agree with the radiologist interpretation   Medicines ordered and prescription drug management: No orders of the defined types were placed in this encounter.   -I have reviewed the patients home medicines and have made adjustments as needed  Critical interventions none   Cardiac Monitoring: The patient was maintained on a cardiac monitor.  I personally viewed and interpreted the cardiac monitored which showed an underlying rhythm of: NSR  Social Determinants of Health:  Factors impacting patients care include: Lives in a skilled nursing facility   Reevaluation: After the interventions noted above, I reevaluated the patient and found that they have :stayed the same  Co morbidities that complicate the patient evaluation  Past Medical History:  Diagnosis Date   Acute cystitis without  hematuria 04/07/2021   Acute encephalopathy 04/07/2021   Acute non-recurrent sinusitis 09/01/2022   CAD (coronary artery disease)    Chicken pox    Colon polyps    Emphysema of lung ([name])    History of thyroid surgery    Hypertension    Hypothyroidism    Influenza A 04/07/2021   Melanoma ([name])    Nodule of right lung    calcified granuloma 2.6 mm on CT from 2017    Splenic artery aneurysm ([name])    2.6 cm    UTI (urinary tract infection) 04/07/2021      Dispostion: I considered admission for this patient, and disposition pending laboratory evaluation.  Please see provider signout note for continuation of workup.     Final Clinical Impression(s) / ED Diagnoses Final diagnoses:  None     @PCDICTATION @    Glendora Score, MD 06/30/23 [phone]

## 2023-06-30 NOTE — ED Notes (Signed)
Ambulatory to b/r with steady gait with daughter at [facility] for standby assist

## 2023-07-15 DIAGNOSIS — I10 Essential (primary) hypertension: Secondary | ICD-10-CM | POA: Diagnosis not present

## 2023-07-15 DIAGNOSIS — M1990 Unspecified osteoarthritis, unspecified site: Secondary | ICD-10-CM | POA: Diagnosis not present

## 2023-07-15 DIAGNOSIS — F0390 Unspecified dementia without behavioral disturbance: Secondary | ICD-10-CM | POA: Diagnosis not present

## 2023-07-15 DIAGNOSIS — E039 Hypothyroidism, unspecified: Secondary | ICD-10-CM | POA: Diagnosis not present

## 2023-07-20 ENCOUNTER — Other Ambulatory Visit: Payer: Self-pay

## 2023-07-20 DIAGNOSIS — I739 Peripheral vascular disease, unspecified: Secondary | ICD-10-CM

## 2023-07-28 ENCOUNTER — Ambulatory Visit (HOSPITAL_COMMUNITY)
Admission: RE | Admit: 2023-07-28 | Discharge: 2023-07-28 | Disposition: A | Discharge status: Home / Self Care | Payer: Medicare Other | Source: Ambulatory Visit | Attending: Vascular Surgery | Admitting: Vascular Surgery

## 2023-07-28 ENCOUNTER — Ambulatory Visit (INDEPENDENT_AMBULATORY_CARE_PROVIDER_SITE_OTHER): Payer: Medicare Other | Admitting: Vascular Surgery

## 2023-07-28 ENCOUNTER — Encounter: Payer: Self-pay | Admitting: Vascular Surgery

## 2023-07-28 VITALS — BP 131/69 | HR 74 | Temp 98.0°F | Resp 20 | Ht 63.0 in | Wt 183.8 lb

## 2023-07-28 DIAGNOSIS — I739 Peripheral vascular disease, unspecified: Secondary | ICD-10-CM | POA: Insufficient documentation

## 2023-07-28 DIAGNOSIS — R2243 Localized swelling, mass and lump, lower limb, bilateral: Secondary | ICD-10-CM | POA: Diagnosis not present

## 2023-07-28 LAB — VAS US ABI WITH/WO TBI
Left ABI: 1.06
Right ABI: 1.1

## 2023-07-28 NOTE — Progress Notes (Unsigned)
 Office Note    HPI: Doris Gray is a 89 y.o. (01/08/1931) female presenting in follow up with known PAD and bilateral lower extremity swelling.  On exam today, [name] was doing well accompanied by her daughter.    She continues to live at a memory care facility, but changed the facility she was out from last year.  She is very happy.  Due to her dementia, most of our conversation was through her daughter.  Bilateral lower extremity swelling has improved dramatically from when I saw her last.  She has no wounds on the legs or feet.  Her daughter is a care that is being provided at her facility.  She denies claudication, ischemic rest pain, tissue loss.  Ambulation is minimal.   Significant effort is made to keep her out of the dependent position during the course of the day.     Current [facility] form notes avoiding hospitalization and providing comfort in place at her facility. DNI/ DNI.  Past Medical History:  Diagnosis Date   Acute cystitis without hematuria 04/07/2021   Acute encephalopathy 04/07/2021   Acute non-recurrent sinusitis 09/01/2022   CAD (coronary artery disease)    Chicken pox    Colon polyps    Emphysema of lung ([name])    History of thyroid surgery    Hypertension    Hypothyroidism    Influenza A 04/07/2021   Melanoma ([name])    Nodule of right lung    calcified granuloma 2.6 mm on CT from 2017    Splenic artery aneurysm ([name])    2.6 cm    UTI (urinary tract infection) 04/07/2021    Past Surgical History:  Procedure Laterality Date   ANEURYSM COILING N/A    OTHER SURGICAL HISTORY     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   THYROID SURGERY     TONSILLECTOMY AND ADENOIDECTOMY      Social History   Socioeconomic History   Marital status: Widowed    Spouse name: Not on file   Number of children: Not on file   Years of education: Not on file   Highest education level: Bachelor's degree (e.g., BA, AB, BS)  Occupational History   Occupation: Retired  Tobacco Use    Smoking status: Former   Smokeless tobacco: Never  Advertising account planner   Vaping status: Never Used  Substance and Sexual Activity   Alcohol use: Not Currently    Comment: Glass of wine occasionally   Drug use: Never   Sexual activity: Not on file  Other Topics Concern   Not on file  Social History Narrative   Not on file   Social Drivers of Health   Financial Resource Strain: Not on file  Food Insecurity: Not on file  Transportation Needs: Not on file  Physical Activity: Not on file  Stress: Not on file  Social Connections: Not on file  Intimate Partner Violence: Not on file   Family History  Adopted: Yes    Current Outpatient Medications  Medication Sig Dispense Refill   ARIPiprazole (ABILIFY) 2 MG tablet TAKE ONE TABLET BY MOUTH AT BEDTIME FOR MOOD (Patient taking differently: Take 5 mg by mouth at bedtime.) 90 tablet 1   fexofenadine (ALLEGRA) 180 MG tablet Take 180 mg by mouth daily.     levothyroxine (SYNTHROID) 137 MCG tablet Take 1 tablet (137 mcg total) by mouth every Monday, Wednesday, and Friday. (Patient taking differently: Take 137 mcg by mouth daily.) 90 tablet 3   losartan (COZAAR) 50 MG tablet Take  1 tablet (50 mg total) by mouth daily. 90 tablet 3   risperiDONE (RISPERDAL) 0.5 MG tablet Take 0.5 mg by mouth 2 (two) times daily.     No current facility-administered medications for this visit.    Allergies  Allergen Reactions   Amlodipine Other (See Comments)    Pt denies    Codeine    Statins     Myalgia    Sulfa Antibiotics    Sulfamethoxazole-Trimethoprim Diarrhea    AKA Bactrim   Sulfasalazine Other (See Comments) and Diarrhea   Tetanus Toxoid Swelling    Had arm swelling  Pt denies     REVIEW OF SYSTEMS:  [X]  denotes positive finding, [ ]  denotes negative finding Cardiac  Comments:  Chest pain or chest pressure:    Shortness of breath upon exertion:    Short of breath when lying flat:    Irregular heart rhythm:        Vascular    Pain in  calf, thigh, or hip brought on by ambulation:    Pain in feet at night that wakes you up from your sleep:     Blood clot in your veins:    Leg swelling:         Pulmonary    Oxygen at home:    Productive cough:     Wheezing:         Neurologic    Sudden weakness in arms or legs:     Sudden numbness in arms or legs:     Sudden onset of difficulty speaking or slurred speech:    Temporary loss of vision in one eye:     Problems with dizziness:         Gastrointestinal    Blood in stool:     Vomited blood:         Genitourinary    Burning when urinating:     Blood in urine:        Psychiatric    Major depression:         Hematologic    Bleeding problems:    Problems with blood clotting too easily:        Skin    Rashes or ulcers:        Constitutional    Fever or chills:      PHYSICAL EXAMINATION:  There were no vitals filed for this visit.   General:  WDWN in NAD; vital signs documented above Gait: Not observed HENT: WNL, normocephalic Pulmonary: normal non-labored breathing , without wheezing Cardiac: regular HR Abdomen: soft, NT, no masses Skin: without rashes Vascular Exam/Pulses:  Right Left  Radial 1+ (weak) 1+ (weak)  Ulnar    Femoral    Popliteal    DP 1+ (weak) absent  PT     Extremities: without ischemic changes, without Gangrene , without cellulitis; without open wounds;  Musculoskeletal: no muscle wasting or atrophy  Neurologic: A&O X 3;  No focal weakness or paresthesias are detected Psychiatric:  The pt has Normal affect.   Non-Invasive Vascular Imaging:       ABI Findings:  +-------+-----------+-----------+------------+------------+  ABI/TBIToday's ABIToday's TBIPrevious ABIPrevious TBI  +-------+-----------+-----------+------------+------------+  Right 1.1        0.58       0.99        1.09          +-------+-----------+-----------+------------+------------+  Left  1.06       0.42                                  +-------+-----------+-----------+------------+------------+  ASSESSMENT/PLAN: Doris Gray is a 89 y.o. female presenting with PAD as well as lower extremity swelling which waxes and wanes. From a PAD standpoint, she is asymptomatic.  Leg swelling is much better than when she was seen last.    I had a long discussion with her daughter regarding Jaryiah's current medical needs.  I think she can follow-up with my office as needed at this time.  I gave both her and her daughter my card, and asked to call if questions or concerns arise.  At this point within her care pathway, with her daughter and myself are on the same page that she is not a surgical candidate and that the best therapy is continued medical management.    Victorino Sparrow, MD [facility] [phone]

## 2023-09-30 DIAGNOSIS — I10 Essential (primary) hypertension: Secondary | ICD-10-CM | POA: Diagnosis not present

## 2023-09-30 DIAGNOSIS — F0390 Unspecified dementia without behavioral disturbance: Secondary | ICD-10-CM | POA: Diagnosis not present

## 2023-12-07 DIAGNOSIS — I10 Essential (primary) hypertension: Secondary | ICD-10-CM | POA: Diagnosis not present

## 2023-12-07 DIAGNOSIS — G4700 Insomnia, unspecified: Secondary | ICD-10-CM | POA: Diagnosis not present

## 2023-12-07 DIAGNOSIS — E039 Hypothyroidism, unspecified: Secondary | ICD-10-CM | POA: Diagnosis not present

## 2024-02-27 DIAGNOSIS — E039 Hypothyroidism, unspecified: Secondary | ICD-10-CM | POA: Diagnosis not present

## 2024-02-27 DIAGNOSIS — F329 Major depressive disorder, single episode, unspecified: Secondary | ICD-10-CM | POA: Diagnosis not present

## 2024-02-27 DIAGNOSIS — I10 Essential (primary) hypertension: Secondary | ICD-10-CM | POA: Diagnosis not present

## 2024-04-04 DIAGNOSIS — I10 Essential (primary) hypertension: Secondary | ICD-10-CM | POA: Diagnosis not present

## 2024-04-04 DIAGNOSIS — F0390 Unspecified dementia without behavioral disturbance: Secondary | ICD-10-CM | POA: Diagnosis not present

## 2024-04-04 DIAGNOSIS — F329 Major depressive disorder, single episode, unspecified: Secondary | ICD-10-CM | POA: Diagnosis not present

## 2024-04-06 DIAGNOSIS — G894 Chronic pain syndrome: Secondary | ICD-10-CM | POA: Diagnosis not present

## 2024-04-06 DIAGNOSIS — F0390 Unspecified dementia without behavioral disturbance: Secondary | ICD-10-CM | POA: Diagnosis not present

## 2024-04-14 DIAGNOSIS — H00015 Hordeolum externum left lower eyelid: Secondary | ICD-10-CM | POA: Diagnosis not present

## 2024-04-14 DIAGNOSIS — G3184 Mild cognitive impairment, so stated: Secondary | ICD-10-CM | POA: Diagnosis not present

## 2024-04-14 DIAGNOSIS — H5712 Ocular pain, left eye: Secondary | ICD-10-CM | POA: Diagnosis not present
